# Patient Record
Sex: Female | Born: 1965 | Race: White | Hispanic: No | Smoking: Former smoker
Health system: Southern US, Community
[De-identification: ages and names within clinical notes are randomized; demographics above are authoritative.]

## PROBLEM LIST (undated history)

## (undated) DIAGNOSIS — Z973 Presence of spectacles and contact lenses: Secondary | ICD-10-CM

## (undated) DIAGNOSIS — Z87440 Personal history of urinary (tract) infections: Secondary | ICD-10-CM

## (undated) DIAGNOSIS — I447 Left bundle-branch block, unspecified: Secondary | ICD-10-CM

## (undated) DIAGNOSIS — N201 Calculus of ureter: Secondary | ICD-10-CM

## (undated) DIAGNOSIS — Z87442 Personal history of urinary calculi: Secondary | ICD-10-CM

## (undated) DIAGNOSIS — Z87448 Personal history of other diseases of urinary system: Secondary | ICD-10-CM

## (undated) DIAGNOSIS — K5090 Crohn's disease, unspecified, without complications: Secondary | ICD-10-CM

## (undated) DIAGNOSIS — R109 Unspecified abdominal pain: Secondary | ICD-10-CM

---

## 1990-12-21 HISTORY — PX: TUBAL LIGATION: SHX77

## 1997-12-21 HISTORY — PX: COLON RESECTION: SHX5231

## 2011-12-22 HISTORY — PX: EXTRACORPOREAL SHOCK WAVE LITHOTRIPSY: SHX1557

## 2017-02-12 ENCOUNTER — Emergency Department (HOSPITAL_COMMUNITY): Payer: Self-pay

## 2017-02-12 ENCOUNTER — Inpatient Hospital Stay (HOSPITAL_COMMUNITY)
Admission: EM | Admit: 2017-02-12 | Discharge: 2017-02-14 | DRG: 694 | Disposition: A | Discharge status: Home / Self Care | Payer: Self-pay | Attending: Family Medicine | Admitting: Family Medicine

## 2017-02-12 ENCOUNTER — Encounter (HOSPITAL_COMMUNITY): Payer: Self-pay | Admitting: Emergency Medicine

## 2017-02-12 DIAGNOSIS — I447 Left bundle-branch block, unspecified: Secondary | ICD-10-CM | POA: Diagnosis present

## 2017-02-12 DIAGNOSIS — R109 Unspecified abdominal pain: Secondary | ICD-10-CM | POA: Diagnosis present

## 2017-02-12 DIAGNOSIS — N138 Other obstructive and reflux uropathy: Secondary | ICD-10-CM

## 2017-02-12 DIAGNOSIS — D259 Leiomyoma of uterus, unspecified: Secondary | ICD-10-CM | POA: Diagnosis present

## 2017-02-12 DIAGNOSIS — N201 Calculus of ureter: Secondary | ICD-10-CM

## 2017-02-12 DIAGNOSIS — E860 Dehydration: Secondary | ICD-10-CM | POA: Diagnosis present

## 2017-02-12 DIAGNOSIS — N179 Acute kidney failure, unspecified: Secondary | ICD-10-CM | POA: Diagnosis present

## 2017-02-12 DIAGNOSIS — N132 Hydronephrosis with renal and ureteral calculous obstruction: Principal | ICD-10-CM | POA: Diagnosis present

## 2017-02-12 DIAGNOSIS — N1330 Unspecified hydronephrosis: Secondary | ICD-10-CM

## 2017-02-12 DIAGNOSIS — R10A2 Flank pain, left side: Secondary | ICD-10-CM | POA: Diagnosis present

## 2017-02-12 DIAGNOSIS — R112 Nausea with vomiting, unspecified: Secondary | ICD-10-CM | POA: Diagnosis present

## 2017-02-12 DIAGNOSIS — R000 Tachycardia, unspecified: Secondary | ICD-10-CM | POA: Diagnosis not present

## 2017-02-12 DIAGNOSIS — E876 Hypokalemia: Secondary | ICD-10-CM | POA: Diagnosis present

## 2017-02-12 DIAGNOSIS — N23 Unspecified renal colic: Secondary | ICD-10-CM

## 2017-02-12 DIAGNOSIS — K5090 Crohn's disease, unspecified, without complications: Secondary | ICD-10-CM | POA: Diagnosis present

## 2017-02-12 DIAGNOSIS — Z9049 Acquired absence of other specified parts of digestive tract: Secondary | ICD-10-CM

## 2017-02-12 DIAGNOSIS — R509 Fever, unspecified: Secondary | ICD-10-CM | POA: Diagnosis not present

## 2017-02-12 DIAGNOSIS — E871 Hypo-osmolality and hyponatremia: Secondary | ICD-10-CM | POA: Diagnosis present

## 2017-02-12 DIAGNOSIS — D696 Thrombocytopenia, unspecified: Secondary | ICD-10-CM | POA: Diagnosis present

## 2017-02-12 DIAGNOSIS — N200 Calculus of kidney: Secondary | ICD-10-CM

## 2017-02-12 DIAGNOSIS — N390 Urinary tract infection, site not specified: Secondary | ICD-10-CM | POA: Diagnosis present

## 2017-02-12 DIAGNOSIS — F17200 Nicotine dependence, unspecified, uncomplicated: Secondary | ICD-10-CM | POA: Diagnosis present

## 2017-02-12 DIAGNOSIS — Z87442 Personal history of urinary calculi: Secondary | ICD-10-CM

## 2017-02-12 HISTORY — DX: Crohn's disease, unspecified, without complications: K50.90

## 2017-02-12 LAB — COMPREHENSIVE METABOLIC PANEL
ALBUMIN: 3 g/dL — AB (ref 3.5–5.0)
ALK PHOS: 56 U/L (ref 38–126)
ALT: 26 U/L (ref 14–54)
AST: 37 U/L (ref 15–41)
Anion gap: 9 (ref 5–15)
BILIRUBIN TOTAL: 0.8 mg/dL (ref 0.3–1.2)
BUN: 23 mg/dL — AB (ref 6–20)
CALCIUM: 8.5 mg/dL — AB (ref 8.9–10.3)
CO2: 22 mmol/L (ref 22–32)
Chloride: 98 mmol/L — ABNORMAL LOW (ref 101–111)
Creatinine, Ser: 1.76 mg/dL — ABNORMAL HIGH (ref 0.44–1.00)
GFR calc Af Amer: 38 mL/min — ABNORMAL LOW (ref >60)
GFR calc non Af Amer: 33 mL/min — ABNORMAL LOW (ref >60)
GLUCOSE: 127 mg/dL — AB (ref 65–99)
POTASSIUM: 3.1 mmol/L — AB (ref 3.5–5.1)
SODIUM: 129 mmol/L — AB (ref 135–145)
TOTAL PROTEIN: 6 g/dL — AB (ref 6.5–8.1)

## 2017-02-12 LAB — CBC WITH DIFFERENTIAL/PLATELET
BASOS PCT: 0 %
Basophils Absolute: 0 10*3/uL (ref 0.0–0.1)
Eosinophils Absolute: 0.1 10*3/uL (ref 0.0–0.7)
Eosinophils Relative: 0 %
HEMATOCRIT: 40 % (ref 36.0–46.0)
HEMOGLOBIN: 14.3 g/dL (ref 12.0–15.0)
LYMPHS ABS: 0.3 10*3/uL — AB (ref 0.7–4.0)
Lymphocytes Relative: 2 %
MCH: 29.9 pg (ref 26.0–34.0)
MCHC: 35.8 g/dL (ref 30.0–36.0)
MCV: 83.7 fL (ref 78.0–100.0)
MONO ABS: 0.5 10*3/uL (ref 0.1–1.0)
Monocytes Relative: 4 %
NEUTROS ABS: 12.1 10*3/uL — AB (ref 1.7–7.7)
NEUTROS PCT: 93 %
Platelets: 106 10*3/uL — ABNORMAL LOW (ref 150–400)
RBC: 4.78 MIL/uL (ref 3.87–5.11)
RDW: 12.8 % (ref 11.5–15.5)
WBC: 13 10*3/uL — ABNORMAL HIGH (ref 4.0–10.5)

## 2017-02-12 LAB — URINALYSIS, ROUTINE W REFLEX MICROSCOPIC
Bilirubin Urine: NEGATIVE
Glucose, UA: NEGATIVE mg/dL
Ketones, ur: NEGATIVE mg/dL
Nitrite: POSITIVE — AB
PH: 5 (ref 5.0–8.0)
Protein, ur: 100 mg/dL — AB
SPECIFIC GRAVITY, URINE: 1.028 (ref 1.005–1.030)

## 2017-02-12 LAB — LIPASE, BLOOD: Lipase: 11 U/L (ref 11–51)

## 2017-02-12 LAB — LACTIC ACID, PLASMA: LACTIC ACID, VENOUS: 2.3 mmol/L — AB (ref 0.5–1.9)

## 2017-02-12 MED ORDER — POTASSIUM CHLORIDE 10 MEQ/100ML IV SOLN
10.0000 meq | Freq: Once | INTRAVENOUS | Status: AC
  Administered 2017-02-12: 10 meq via INTRAVENOUS
  Filled 2017-02-12: qty 100

## 2017-02-12 MED ORDER — FENTANYL CITRATE (PF) 100 MCG/2ML IJ SOLN
50.0000 ug | Freq: Once | INTRAMUSCULAR | Status: AC
  Administered 2017-02-12: 50 ug via INTRAMUSCULAR
  Filled 2017-02-12: qty 2

## 2017-02-12 MED ORDER — PROMETHAZINE HCL 25 MG/ML IJ SOLN
25.0000 mg | Freq: Once | INTRAMUSCULAR | Status: AC
  Administered 2017-02-12: 25 mg via INTRAMUSCULAR
  Filled 2017-02-12: qty 1

## 2017-02-12 MED ORDER — MORPHINE SULFATE (PF) 4 MG/ML IV SOLN: 4.0000 mg | INTRAVENOUS | Status: DC | PRN

## 2017-02-12 MED ORDER — MORPHINE SULFATE (PF) 2 MG/ML IV SOLN
2.0000 mg | INTRAVENOUS | Status: DC | PRN
  Administered 2017-02-12 – 2017-02-13 (×2): 2 mg via INTRAVENOUS
  Filled 2017-02-12 (×2): qty 1

## 2017-02-12 MED ORDER — DEXTROSE 5 % IV SOLN
1.0000 g | Freq: Once | INTRAVENOUS | Status: AC
  Administered 2017-02-12: 1 g via INTRAVENOUS
  Filled 2017-02-12: qty 10

## 2017-02-12 MED ORDER — SODIUM CHLORIDE 0.9 % IV BOLUS (SEPSIS)
2000.0000 mL | Freq: Once | INTRAVENOUS | Status: AC
  Administered 2017-02-12: 2000 mL via INTRAVENOUS

## 2017-02-12 MED ORDER — ONDANSETRON HCL 4 MG PO TABS: 4.0000 mg | ORAL_TABLET | Freq: Four times a day (QID) | ORAL | Status: DC | PRN

## 2017-02-12 MED ORDER — SODIUM CHLORIDE 0.9 % IV SOLN: INTRAVENOUS | Status: DC

## 2017-02-12 MED ORDER — ONDANSETRON HCL 4 MG/2ML IJ SOLN
4.0000 mg | Freq: Four times a day (QID) | INTRAMUSCULAR | Status: DC | PRN
  Administered 2017-02-12 – 2017-02-13 (×2): 4 mg via INTRAVENOUS
  Filled 2017-02-12 (×2): qty 2

## 2017-02-12 MED ORDER — ONDANSETRON HCL 4 MG/2ML IJ SOLN: 4.0000 mg | INTRAMUSCULAR | Status: DC | PRN

## 2017-02-12 NOTE — Consult Note (Signed)
Spoke with Dr. Thurnell Garbe regarding patient with poorly controlled pain due to left 24mm  Mid-ureteral stone with severe obstruction.  We discussed admitting the patient to the hospital service and transferring her to [facility].  She should have her electrolytes corrected and we will plan to place a stent tomorrow morning.

## 2017-02-12 NOTE — ED Notes (Signed)
CRITICAL VALUE ALERT  Critical value received:  Lactic Acid 2.3  Date of notification:  02/12/2017  Time of notification:  2344  Critical value read back:Yes.    Nurse who received alert:  Charlies Silvers, RN  MD notified (1st page):  Shanon Brow  Time of first page:  2346  MD notified (2nd page):  Time of second page:  Responding MD:  Rosana Hoes  Time MD responded:  [id number]

## 2017-02-12 NOTE — ED Triage Notes (Signed)
Patient complaining of left flank pain off and on x 4 days worsening yesterday. Also complaining of vomiting. States "I went to [facility] yesterday and they said I have a kidney stone. I don't have insurance so they just sent me home."

## 2017-02-12 NOTE — ED Provider Notes (Signed)
[facility] DEPT Provider Note   CSN: [id number] Arrival date & time: 02/12/17  1956     History   Chief Complaint Chief Complaint  Patient presents with  . Flank Pain    HPI Maria Chavez is a 51 y.o. female.  HPI  Pt was seen at 2025. Per pt, c/o sudden onset and persistence of waxing and waning left sided flank "pain" for the past 4 days.  Pt describes the pain as "like my last kidney stone." Has been associated with multiple intermittent episodes of N/V. Pt states she was evaluated at [facility] ED and "then discharged because I didn't have insurance." Pt cannot recall what prescriptions she was given. Denies vaginal bleeding/discharge, no dysuria/hematuria, no abd pain, no diarrhea, no black or blood in emesis, no CP/SOB, no fevers, no rash.    Past Medical History:  Diagnosis Date  . Crohn's disease ([place])   . Fibroids   . Kidney stones     There are no active problems to display for this patient.   Past Surgical History:  Procedure Laterality Date  . CHOLECYSTECTOMY    . COLON SURGERY    . TUBAL LIGATION      OB History    No data available       Home Medications    Prior to Admission medications   Not on File    Family History History reviewed. No pertinent family history.  Social History Social History  Substance Use Topics  . Smoking status: Current Every Day Smoker  . Smokeless tobacco: Never Used  . Alcohol use No     Allergies   Patient has no known allergies.   Review of Systems Review of Systems ROS: Statement: All systems negative except as marked or noted in the HPI; Constitutional: Negative for fever and chills. ; ; Eyes: Negative for eye pain, redness and discharge. ; ; ENMT: Negative for ear pain, hoarseness, nasal congestion, sinus pressure and sore throat. ; ; Cardiovascular: Negative for chest pain, palpitations, diaphoresis, dyspnea and peripheral edema. ; ; Respiratory: Negative for cough, wheezing and stridor. ; ;  Gastrointestinal: +N/V. Negative for diarrhea, abdominal pain, blood in stool, hematemesis, jaundice and rectal bleeding. . ; ; Genitourinary: +Flank pain. Negative for dysuria and hematuria. ; ; Musculoskeletal: Negative for neck pain. Negative for swelling and trauma.; ; Skin: Negative for pruritus, rash, abrasions, blisters, bruising and skin lesion.; ; Neuro: Negative for headache, lightheadedness and neck stiffness. Negative for weakness, altered level of consciousness, altered mental status, extremity weakness, paresthesias, involuntary movement, seizure and syncope.      Physical Exam Updated Vital Signs BP 103/67 (BP Location: Right Arm)   Pulse (!) 132   Temp 98.3 F (36.8 C) (Oral)   Resp 19   Ht 5\' 3"  (1.6 m)   Wt 130 lb (59 kg)   SpO2 99%   BMI 23.03 kg/m    BP 114/79 (BP Location: Right Arm)   Pulse 119   Temp 98.3 F (36.8 C) (Oral)   Resp 20   Ht 5\' 3"  (1.6 m)   Wt 130 lb (59 kg)   SpO2 100%   BMI 23.03 kg/m    Physical Exam 2030: Physical examination:  Nursing notes reviewed; Vital signs and O2 SAT reviewed;  Constitutional: Well developed, Well nourished, Well hydrated, Uncomfortable appearing.; Head:  Normocephalic, atraumatic; Eyes: EOMI, PERRL, No scleral icterus; ENMT: Mouth and pharynx normal, Mucous membranes moist; Neck: Supple, Full range of motion, No lymphadenopathy; Cardiovascular: Tachycardic rate and  rhythm, No gallop; Respiratory: Breath sounds clear & equal bilaterally, No wheezes.  Speaking full sentences with ease, Normal respiratory effort/excursion; Chest: Nontender, Movement normal; Abdomen: Soft, Nontender, Nondistended, Normal bowel sounds; Genitourinary: No CVA tenderness; Spine:  No midline CS, TS, LS tenderness. +TTP left lumbar paraspinal muscles.;; Extremities: Pulses normal, No tenderness, No edema, No calf edema or asymmetry.; Neuro: AA&Ox3, Major CN grossly intact.  Speech clear. No gross focal motor or sensory deficits in extremities.  Climbs on and off stretcher easily by herself. Gait steady.; Skin: Color normal, Warm, Dry.   ED Treatments / Results  Labs (all labs ordered are listed, but only abnormal results are displayed)   EKG  EKG Interpretation None       Radiology   Procedures Procedures (including critical care time)  Medications Ordered in ED Medications  fentaNYL (SUBLIMAZE) injection 50 mcg (not administered)  promethazine (PHENERGAN) injection 25 mg (not administered)     Initial Impression / Assessment and Plan / ED Course  I have reviewed the triage vital signs and the nursing notes.  Pertinent labs & imaging results that were available during my care of the patient were reviewed by me and considered in my medical decision making (see chart for details).  MDM Reviewed: nursing note and vitals Interpretation: labs and CT scan Total time providing critical care: 30-74 minutes. This excludes time spent performing separately reportable procedures and services. Consults: urology and admitting MD   CRITICAL CARE Performed by: Alfonzo Feller Total critical care time: 35 minutes Critical care time was exclusive of separately billable procedures and treating other patients. Critical care was necessary to treat or prevent imminent or life-threatening deterioration. Critical care was time spent personally by me on the following activities: development of treatment plan with patient and/or surrogate as well as nursing, discussions with consultants, evaluation of patient's response to treatment, examination of patient, obtaining history from patient or surrogate, ordering and performing treatments and interventions, ordering and review of laboratory studies, ordering and review of radiographic studies, pulse oximetry and re-evaluation of patient's condition.   Results for orders placed or performed during the hospital encounter of 02/12/17  Urinalysis, Routine w reflex microscopic- may I&O  cath if menses  Result Value Ref Range   Color, Urine YELLOW YELLOW   APPearance HAZY (A) CLEAR   Specific Gravity, Urine 1.028 1.005 - 1.030   pH 5.0 5.0 - 8.0   Glucose, UA NEGATIVE NEGATIVE mg/dL   Hgb urine dipstick MODERATE (A) NEGATIVE   Bilirubin Urine NEGATIVE NEGATIVE   Ketones, ur NEGATIVE NEGATIVE mg/dL   Protein, ur 100 (A) NEGATIVE mg/dL   Nitrite POSITIVE (A) NEGATIVE   Leukocytes, UA TRACE (A) NEGATIVE   RBC / HPF 0-5 0 - 5 RBC/hpf   WBC, UA 0-5 0 - 5 WBC/hpf   Bacteria, UA RARE (A) NONE SEEN  Comprehensive metabolic panel  Result Value Ref Range   Sodium 129 (L) 135 - 145 mmol/L   Potassium 3.1 (L) 3.5 - 5.1 mmol/L   Chloride 98 (L) 101 - 111 mmol/L   CO2 22 22 - 32 mmol/L   Glucose, Bld 127 (H) 65 - 99 mg/dL   BUN 23 (H) 6 - 20 mg/dL   Creatinine, Ser 1.76 (H) 0.44 - 1.00 mg/dL   Calcium 8.5 (L) 8.9 - 10.3 mg/dL   Total Protein 6.0 (L) 6.5 - 8.1 g/dL   Albumin 3.0 (L) 3.5 - 5.0 g/dL   AST 37 15 - 41 U/L   ALT  26 14 - 54 U/L   Alkaline Phosphatase 56 38 - 126 U/L   Total Bilirubin 0.8 0.3 - 1.2 mg/dL   GFR calc non Af Amer 33 (L) >60 mL/min   GFR calc Af Amer 38 (L) >60 mL/min   Anion gap 9 5 - 15  Lipase, blood  Result Value Ref Range   Lipase 11 11 - 51 U/L  CBC with Differential  Result Value Ref Range   WBC 13.0 (H) 4.0 - 10.5 K/uL   RBC 4.78 3.87 - 5.11 MIL/uL   Hemoglobin 14.3 12.0 - 15.0 g/dL   HCT 40.0 36.0 - 46.0 %   MCV 83.7 78.0 - 100.0 fL   MCH 29.9 26.0 - 34.0 pg   MCHC 35.8 30.0 - 36.0 g/dL   RDW 12.8 11.5 - 15.5 %   Platelets 106 (L) 150 - 400 K/uL   Neutrophils Relative % 93 %   Neutro Abs 12.1 (H) 1.7 - 7.7 K/uL   Lymphocytes Relative 2 %   Lymphs Abs 0.3 (L) 0.7 - 4.0 K/uL   Monocytes Relative 4 %   Monocytes Absolute 0.5 0.1 - 1.0 K/uL   Eosinophils Relative 0 %   Eosinophils Absolute 0.1 0.0 - 0.7 K/uL   Basophils Relative 0 %   Basophils Absolute 0.0 0.0 - 0.1 K/uL   Ct Renal Stone Study Result Date: 02/12/2017 CLINICAL  DATA:  Initial evaluation for acute left flank pain for 4 days. Per history, recent CT scan with IV contrast. EXAM: CT ABDOMEN AND PELVIS WITHOUT CONTRAST TECHNIQUE: Multidetector CT imaging of the abdomen and pelvis was performed following the standard protocol without IV contrast. COMPARISON:  None available. FINDINGS: Lower chest: Small layering left pleural effusion with associated atelectasis. Scattered atelectatic changes with trace effusion at the right lung base is well. Hepatobiliary: The liver demonstrates a normal unenhanced appearance. Gallbladder surgically absent. Prominence of the common bile duct like related to post cholecystectomy changes. Pancreas: Pancreas within normal limits. Spleen: Spleen within normal limits. Adrenals/Urinary Tract: Adrenal glands are normal. Left kidney is asymmetrically enlarged as compared to the right. Parenchymal contrast staining present within left kidney with secrete IV contrast material within the proximal left renal collecting system. There is an obstructive stone measuring approximately 12 mm at the left UPJ (series 5, image 36). No IV contrast material seen distally within the left ureter. Associated perinephric and periureteral fat stranding. Moderate to severe left hydronephrosis. No other definite stones within left kidney, although evaluation limited by presence of IV contrast. Small amount secrete IV contrast material present within the right renal collecting system. No definite right-sided renal or ureteral calculi. No right-sided hydronephrosis or hydroureter. Bladder partially distended with secreted contrast material within the bladder lumen. Stomach/Bowel: Stomach within normal limits. No evidence for bowel obstruction. Appendix appears to be surgically absent. Additional anastomotic suture present about the sigmoid colon. No acute inflammatory changes about the bowels. Scattered free fluid approximates the descending colon related to the obstructive  process within the left kidney. Vascular/Lymphatic: Mild aortic atherosclerosis. No aneurysm. No adenopathy. Reproductive: Uterus and ovaries within normal limits. Other: No free air. Small amount of free fluid related to the obstructive process within the left kidney. Tiny fat containing paraumbilical hernia noted. Musculoskeletal: No acute osseus abnormality. No worrisome lytic or blastic osseous lesions. Central disc protrusion noted at L5-S1. IMPRESSION: 1. 12 mm obstructive left UPJ stone with secondary moderate to severe left hydronephrosis. Retained IV contrast material on board from recent CT. 2.  Small layering left pleural effusion with associated atelectasis. 3. Central disc protrusion at L5-S1. Electronically Signed   By: Jeannine Boga M.D.   On: 02/12/2017 21:43    2225:  Hyponatremia on labs; judicious IVF started 2/2 hydronephrosis. Potassium repleted IV. +UTI, UC pending; will dose IV rocephin. T/C to U[facility] Dr. Louis Meckel, case discussed, including:  HPI, pertinent PM/SHx, VS/PE, dx testing, ED course and treatment:  Agreeable to consult, requests to transfer to [facility] under [facility], he will take pt to OR tomorrow morning. T/C to [facility] Dr. Shanon Brow, case discussed, including:  HPI, pertinent PM/SHx, VS/PE, dx testing, ED course and treatment:  Agreeable to facilitate transfer/admit to [facility].     Final Clinical Impressions(s) / ED Diagnoses   Final diagnoses:  None    New Prescriptions New Prescriptions   No medications on file     Francine Graven, DO 02/14/17 1527

## 2017-02-12 NOTE — H&P (Signed)
History and Physical    Maria Chavez [id number] DOB: [birth year] DOA: 02/12/2017  PCP: No PCP Per Patient  Patient coming from: home  Chief Complaint:  Left flank pain, n/v  HPI: Maria Chavez is a 51 y.o. female with medical history significant of crohns comes in with 4 days of left flank pain, vomiting.  Pt went to [place] ed yesterday started on abx she thinks is levaquin, her symptoms just got worse.  Reports fevers.  No dysuria.  Vomit nonbloody.  Pt found to have 12 mm stone on left, urology called at [facility] dr Louis Meckel who advised transfer there for procedure in am.   Pt has no h/o kidney problems.  Review of Systems: As per HPI otherwise 10 point review of systems negative.   Past Medical History:  Diagnosis Date  . Crohn's disease ([place])   . Fibroids   . Kidney stones     Past Surgical History:  Procedure Laterality Date  . CHOLECYSTECTOMY    . COLON SURGERY    . TUBAL LIGATION       reports that she has been smoking.  She has never used smokeless tobacco. She reports that she does not drink alcohol or use drugs.  No Known Allergies  History reviewed. No pertinent family history. no premature CAD  Prior to Admission medications   Not on File  abx from yesterday  Physical Exam: Vitals:   02/12/17 2018 02/12/17 2019  BP: 103/67   Pulse: (!) 132   Resp: 19   Temp: 98.3 F (36.8 C)   TempSrc: Oral   SpO2: 99%   Weight:  59 kg (130 lb)  Height:  5\' 3"  (1.6 m)     Constitutional: NAD, calm, comfortable, dehydrated Vitals:   02/12/17 2018 02/12/17 2019  BP: 103/67   Pulse: (!) 132   Resp: 19   Temp: 98.3 F (36.8 C)   TempSrc: Oral   SpO2: 99%   Weight:  59 kg (130 lb)  Height:  5\' 3"  (1.6 m)   Eyes: PERRL, lids and conjunctivae normal ENMT: Mucous membranes are moist. Posterior pharynx clear of any exudate or lesions.Normal dentition.  Neck: normal, supple, no masses, no thyromegaly Respiratory: clear to auscultation bilaterally, no  wheezing, no crackles. Normal respiratory effort. No accessory muscle use.  Cardiovascular: Regular rate and rhythm, no murmurs / rubs / gallops. No extremity edema. 2+ pedal pulses. No carotid bruits.  Abdomen: left flank tenderness, no masses palpated. No hepatosplenomegaly. Bowel sounds positive.  Musculoskeletal: no clubbing / cyanosis. No joint deformity upper and lower extremities. Good ROM, no contractures. Normal muscle tone.  Skin: no rashes, lesions, ulcers. No induration Neurologic: CN 2-12 grossly intact. Sensation intact, DTR normal. Strength 5/5 in all 4.  Psychiatric: Normal judgment and insight. Alert and oriented x 3. Normal mood.    Labs on Admission: I have personally reviewed following labs and imaging studies  CBC:  Recent Labs Lab 02/12/17 2056  WBC 13.0*  NEUTROABS 12.1*  HGB 14.3  HCT 40.0  MCV 83.7  PLT [id number]*   Basic Metabolic Panel:  Recent Labs Lab 02/12/17 2056  NA 129*  K 3.1*  CL 98*  CO2 22  GLUCOSE 127*  BUN 23*  CREATININE 1.76*  CALCIUM 8.5*   GFR: Estimated Creatinine Clearance: 31.6 mL/min (by C-G formula based on SCr of 1.76 mg/dL (H)). Liver Function Tests:  Recent Labs Lab 02/12/17 2056  AST 37  ALT 26  ALKPHOS 56  BILITOT 0.8  PROT  6.0*  ALBUMIN 3.0*    Recent Labs Lab 02/12/17 2056  LIPASE 11   Urine analysis:    Component Value Date/Time   COLORURINE YELLOW 02/12/2017 2023   APPEARANCEUR HAZY (A) 02/12/2017 2023   LABSPEC 1.028 02/12/2017 2023   PHURINE 5.0 02/12/2017 2023   GLUCOSEU NEGATIVE 02/12/2017 2023   HGBUR MODERATE (A) 02/12/2017 2023   BILIRUBINUR NEGATIVE 02/12/2017 2023   KETONESUR NEGATIVE 02/12/2017 2023   PROTEINUR 100 (A) 02/12/2017 2023   NITRITE POSITIVE (A) 02/12/2017 2023   LEUKOCYTESUR TRACE (A) 02/12/2017 2023   Radiological Exams on Admission: Ct Renal Stone Study  Result Date: 02/12/2017 CLINICAL DATA:  Initial evaluation for acute left flank pain for 4 days. Per history,  recent CT scan with IV contrast. EXAM: CT ABDOMEN AND PELVIS WITHOUT CONTRAST TECHNIQUE: Multidetector CT imaging of the abdomen and pelvis was performed following the standard protocol without IV contrast. COMPARISON:  None available. FINDINGS: Lower chest: Small layering left pleural effusion with associated atelectasis. Scattered atelectatic changes with trace effusion at the right lung base is well. Hepatobiliary: The liver demonstrates a normal unenhanced appearance. Gallbladder surgically absent. Prominence of the common bile duct like related to post cholecystectomy changes. Pancreas: Pancreas within normal limits. Spleen: Spleen within normal limits. Adrenals/Urinary Tract: Adrenal glands are normal. Left kidney is asymmetrically enlarged as compared to the right. Parenchymal contrast staining present within left kidney with secrete IV contrast material within the proximal left renal collecting system. There is an obstructive stone measuring approximately 12 mm at the left UPJ (series 5, image 36). No IV contrast material seen distally within the left ureter. Associated perinephric and periureteral fat stranding. Moderate to severe left hydronephrosis. No other definite stones within left kidney, although evaluation limited by presence of IV contrast. Small amount secrete IV contrast material present within the right renal collecting system. No definite right-sided renal or ureteral calculi. No right-sided hydronephrosis or hydroureter. Bladder partially distended with secreted contrast material within the bladder lumen. Stomach/Bowel: Stomach within normal limits. No evidence for bowel obstruction. Appendix appears to be surgically absent. Additional anastomotic suture present about the sigmoid colon. No acute inflammatory changes about the bowels. Scattered free fluid approximates the descending colon related to the obstructive process within the left kidney. Vascular/Lymphatic: Mild aortic  atherosclerosis. No aneurysm. No adenopathy. Reproductive: Uterus and ovaries within normal limits. Other: No free air. Small amount of free fluid related to the obstructive process within the left kidney. Tiny fat containing paraumbilical hernia noted. Musculoskeletal: No acute osseus abnormality. No worrisome lytic or blastic osseous lesions. Central disc protrusion noted at L5-S1. IMPRESSION: 1. 12 mm obstructive left UPJ stone with secondary moderate to severe left hydronephrosis. Retained IV contrast material on board from recent CT. 2. Small layering left pleural effusion with associated atelectasis. 3. Central disc protrusion at L5-S1. Electronically Signed   By: Jeannine Boga M.D.   On: 02/12/2017 21:43    EKG: Independently reviewed. Sinus tachy Old chart reviewed Case discussed with edp Case discussed with ed rn   Assessment/Plan 51 yo female with 4 days of obs left renal stone with AKI and dehydration  Principal Problem:   Urinary tract obstruction due to kidney stone- with AKI cr up to 1.8.  Give 2 liters ivf bolus now.  Cover with rocephin.  uc sent.  Check lactic acid level.  Transfer to [place] keep npo for procedure in am for stone removal by urology team.  Active Problems:   AKI (acute kidney injury) (  [place])- aggressive ivf, repeat cr in am   Acute left flank pain- due to above   Nausea & vomiting- due to above, prn zofran ordered   Hypokalemia- replete through iv, due to vomiting     DVT prophylaxis:  scds Code Status:  full Family Communication: none  Disposition Plan:  Per day team, likely tomorrow after lithotripsy Consults called:  Urology at [facility] Admission status:  Observation status   Maniah Nading A MD [facility]  If 7PM-7AM, please contact night-coverage [email or web address] Password [facility]  02/12/2017, 10:47 PM

## 2017-02-13 ENCOUNTER — Encounter (HOSPITAL_COMMUNITY)
Admission: EM | Disposition: A | Discharge status: Home / Self Care | Payer: Self-pay | Source: Home / Self Care | Attending: Family Medicine

## 2017-02-13 ENCOUNTER — Observation Stay (HOSPITAL_BASED_OUTPATIENT_CLINIC_OR_DEPARTMENT_OTHER): Payer: Self-pay

## 2017-02-13 ENCOUNTER — Observation Stay (HOSPITAL_COMMUNITY): Payer: Self-pay | Admitting: Anesthesiology

## 2017-02-13 ENCOUNTER — Encounter (HOSPITAL_COMMUNITY): Payer: Self-pay | Admitting: Anesthesiology

## 2017-02-13 DIAGNOSIS — R9431 Abnormal electrocardiogram [ECG] [EKG]: Secondary | ICD-10-CM

## 2017-02-13 HISTORY — PX: TRANSTHORACIC ECHOCARDIOGRAM: SHX275

## 2017-02-13 HISTORY — PX: CYSTOSCOPY W/ URETERAL STENT PLACEMENT: SHX1429

## 2017-02-13 LAB — BASIC METABOLIC PANEL
Anion gap: 9 (ref 5–15)
BUN: 23 mg/dL — AB (ref 6–20)
CHLORIDE: 104 mmol/L (ref 101–111)
CO2: 22 mmol/L (ref 22–32)
CREATININE: 1.53 mg/dL — AB (ref 0.44–1.00)
Calcium: 8.4 mg/dL — ABNORMAL LOW (ref 8.9–10.3)
GFR calc Af Amer: 45 mL/min — ABNORMAL LOW (ref >60)
GFR, EST NON AFRICAN AMERICAN: 39 mL/min — AB (ref >60)
GLUCOSE: 112 mg/dL — AB (ref 65–99)
POTASSIUM: 3.3 mmol/L — AB (ref 3.5–5.1)
Sodium: 135 mmol/L (ref 135–145)

## 2017-02-13 LAB — CBC
HEMATOCRIT: 37 % (ref 36.0–46.0)
Hemoglobin: 13.2 g/dL (ref 12.0–15.0)
MCH: 29.4 pg (ref 26.0–34.0)
MCHC: 35.7 g/dL (ref 30.0–36.0)
MCV: 82.4 fL (ref 78.0–100.0)
Platelets: 92 10*3/uL — ABNORMAL LOW (ref 150–400)
RBC: 4.49 MIL/uL (ref 3.87–5.11)
RDW: 13 % (ref 11.5–15.5)
WBC: 11.9 10*3/uL — ABNORMAL HIGH (ref 4.0–10.5)

## 2017-02-13 LAB — TROPONIN I
Troponin I: 0.04 ng/mL (ref <0.03)
Troponin I: 0.03 ng/mL (ref <0.03)
Troponin I: 0.03 ng/mL (ref <0.03)

## 2017-02-13 LAB — ECHOCARDIOGRAM COMPLETE
Height: 63 in
Weight: 2080 oz

## 2017-02-13 LAB — SURGICAL PCR SCREEN
MRSA, PCR: NEGATIVE
Staphylococcus aureus: NEGATIVE

## 2017-02-13 LAB — HIV ANTIBODY (ROUTINE TESTING W REFLEX): HIV SCREEN 4TH GENERATION: NONREACTIVE

## 2017-02-13 LAB — LACTIC ACID, PLASMA: Lactic Acid, Venous: 1.9 mmol/L (ref 0.5–1.9)

## 2017-02-13 SURGERY — CYSTOSCOPY, WITH RETROGRADE PYELOGRAM AND URETERAL STENT INSERTION
Anesthesia: General | Site: Ureter | Laterality: Left

## 2017-02-13 MED ORDER — MEPERIDINE HCL 50 MG/ML IJ SOLN: 6.2500 mg | INTRAMUSCULAR | Status: DC | PRN

## 2017-02-13 MED ORDER — DEXTROSE 5 % IV SOLN
1.0000 g | INTRAVENOUS | Status: DC
  Administered 2017-02-13: 1 g via INTRAVENOUS
  Filled 2017-02-13 (×2): qty 10

## 2017-02-13 MED ORDER — ONDANSETRON 4 MG PO TBDP
4.0000 mg | ORAL_TABLET | ORAL | Status: DC | PRN
  Administered 2017-02-14: 4 mg via ORAL
  Filled 2017-02-13 (×2): qty 1

## 2017-02-13 MED ORDER — DEXAMETHASONE SODIUM PHOSPHATE 10 MG/ML IJ SOLN
INTRAMUSCULAR | Status: DC | PRN
  Administered 2017-02-13: 10 mg via INTRAVENOUS

## 2017-02-13 MED ORDER — OXYCODONE-ACETAMINOPHEN 5-325 MG PO TABS
1.0000 | ORAL_TABLET | ORAL | Status: DC | PRN
  Administered 2017-02-13 – 2017-02-14 (×4): 1 via ORAL
  Filled 2017-02-13 (×4): qty 1

## 2017-02-13 MED ORDER — ONDANSETRON HCL 4 MG/2ML IJ SOLN
INTRAMUSCULAR | Status: DC | PRN
  Administered 2017-02-13: 4 mg via INTRAVENOUS

## 2017-02-13 MED ORDER — FENTANYL CITRATE (PF) 100 MCG/2ML IJ SOLN
INTRAMUSCULAR | Status: AC
  Filled 2017-02-13: qty 2

## 2017-02-13 MED ORDER — PHENYLEPHRINE 40 MCG/ML (10ML) SYRINGE FOR IV PUSH (FOR BLOOD PRESSURE SUPPORT)
PREFILLED_SYRINGE | INTRAVENOUS | Status: DC | PRN
  Administered 2017-02-13 (×3): 80 ug via INTRAVENOUS

## 2017-02-13 MED ORDER — PHENYLEPHRINE 40 MCG/ML (10ML) SYRINGE FOR IV PUSH (FOR BLOOD PRESSURE SUPPORT)
PREFILLED_SYRINGE | INTRAVENOUS | Status: AC
  Filled 2017-02-13: qty 10

## 2017-02-13 MED ORDER — NICOTINE 14 MG/24HR TD PT24
14.0000 mg | MEDICATED_PATCH | Freq: Every day | TRANSDERMAL | Status: DC
  Administered 2017-02-13 – 2017-02-14 (×2): 14 mg via TRANSDERMAL
  Filled 2017-02-13 (×2): qty 1

## 2017-02-13 MED ORDER — PROPOFOL 10 MG/ML IV BOLUS
INTRAVENOUS | Status: DC | PRN
  Administered 2017-02-13: 160 mg via INTRAVENOUS

## 2017-02-13 MED ORDER — LIDOCAINE 2% (20 MG/ML) 5 ML SYRINGE
INTRAMUSCULAR | Status: AC
  Filled 2017-02-13: qty 5

## 2017-02-13 MED ORDER — IOHEXOL 300 MG/ML  SOLN
INTRAMUSCULAR | Status: DC | PRN
  Administered 2017-02-13: 5 mL

## 2017-02-13 MED ORDER — MIDAZOLAM HCL 2 MG/2ML IJ SOLN
INTRAMUSCULAR | Status: AC
  Filled 2017-02-13: qty 2

## 2017-02-13 MED ORDER — MORPHINE SULFATE (PF) 4 MG/ML IV SOLN
2.0000 mg | INTRAVENOUS | Status: DC | PRN
  Administered 2017-02-13 – 2017-02-14 (×7): 2 mg via INTRAVENOUS
  Filled 2017-02-13 (×8): qty 1

## 2017-02-13 MED ORDER — PROPOFOL 10 MG/ML IV BOLUS
INTRAVENOUS | Status: AC
  Filled 2017-02-13: qty 20

## 2017-02-13 MED ORDER — SODIUM CHLORIDE 0.9 % IV SOLN
INTRAVENOUS | Status: AC
  Administered 2017-02-13: 1000 mL via INTRAVENOUS
  Administered 2017-02-13: 09:00:00 via INTRAVENOUS

## 2017-02-13 MED ORDER — CIPROFLOXACIN IN D5W 400 MG/200ML IV SOLN
INTRAVENOUS | Status: AC
  Filled 2017-02-13: qty 200

## 2017-02-13 MED ORDER — ENOXAPARIN SODIUM 40 MG/0.4ML ~~LOC~~ SOLN
40.0000 mg | SUBCUTANEOUS | Status: DC
  Administered 2017-02-14: 40 mg via SUBCUTANEOUS
  Filled 2017-02-13: qty 0.4

## 2017-02-13 MED ORDER — CIPROFLOXACIN IN D5W 400 MG/200ML IV SOLN
INTRAVENOUS | Status: DC | PRN
  Administered 2017-02-13: 400 mg via INTRAVENOUS

## 2017-02-13 MED ORDER — POTASSIUM CHLORIDE CRYS ER 20 MEQ PO TBCR
40.0000 meq | EXTENDED_RELEASE_TABLET | Freq: Once | ORAL | Status: AC
  Administered 2017-02-13: 40 meq via ORAL
  Filled 2017-02-13: qty 2

## 2017-02-13 MED ORDER — LIDOCAINE 2% (20 MG/ML) 5 ML SYRINGE
INTRAMUSCULAR | Status: DC | PRN
  Administered 2017-02-13: 100 mg via INTRAVENOUS

## 2017-02-13 MED ORDER — FENTANYL CITRATE (PF) 100 MCG/2ML IJ SOLN
INTRAMUSCULAR | Status: DC | PRN
  Administered 2017-02-13 (×2): 50 ug via INTRAVENOUS

## 2017-02-13 MED ORDER — SODIUM CHLORIDE 0.9 % IR SOLN
Status: DC | PRN
  Administered 2017-02-13: 3000 mL via INTRAVESICAL

## 2017-02-13 MED ORDER — HYDROMORPHONE HCL 1 MG/ML IJ SOLN
0.2500 mg | INTRAMUSCULAR | Status: DC | PRN
  Administered 2017-02-13 (×2): 0.25 mg via INTRAVENOUS
  Administered 2017-02-13: 0.5 mg via INTRAVENOUS

## 2017-02-13 MED ORDER — HYDROMORPHONE HCL 1 MG/ML IJ SOLN
INTRAMUSCULAR | Status: AC
  Administered 2017-02-13: 10:00:00
  Filled 2017-02-13: qty 1

## 2017-02-13 MED ORDER — MIDAZOLAM HCL 5 MG/5ML IJ SOLN
INTRAMUSCULAR | Status: DC | PRN
  Administered 2017-02-13: 2 mg via INTRAVENOUS

## 2017-02-13 MED ORDER — METOCLOPRAMIDE HCL 5 MG/ML IJ SOLN: 10.0000 mg | Freq: Once | INTRAMUSCULAR | Status: DC | PRN

## 2017-02-13 SURGICAL SUPPLY — 20 items
BAG URINE DRAINAGE (UROLOGICAL SUPPLIES) ×3 IMPLANT
BAG URO CATCHER STRL LF (MISCELLANEOUS) ×3 IMPLANT
BASKET DAKOTA 1.9FR 11X120 (BASKET) IMPLANT
BASKET ZERO TIP NITINOL 2.4FR (BASKET) IMPLANT
CATH URET 5FR 28IN OPEN ENDED (CATHETERS) ×3 IMPLANT
CLOTH BEACON ORANGE TIMEOUT ST (SAFETY) ×3 IMPLANT
GLOVE BIOGEL M STRL SZ7.5 (GLOVE) ×3 IMPLANT
GOWN STRL REUS W/TWL XL LVL3 (GOWN DISPOSABLE) ×3 IMPLANT
GUIDEWIRE ANG ZIPWIRE 038X150 (WIRE) IMPLANT
GUIDEWIRE STR DUAL SENSOR (WIRE) ×3 IMPLANT
MANIFOLD NEPTUNE II (INSTRUMENTS) ×3 IMPLANT
PACK CYSTO (CUSTOM PROCEDURE TRAY) ×3 IMPLANT
SHEATH ACCESS URETERAL 24CM (SHEATH) IMPLANT
SHEATH ACCESS URETERAL 38CM (SHEATH) IMPLANT
SHEATH ACCESS URETERAL 54CM (SHEATH) IMPLANT
STENT URET 6FRX24 CONTOUR (STENTS) ×3 IMPLANT
TRAY FOLEY CATH 16FR SILVER (SET/KITS/TRAYS/PACK) ×3 IMPLANT
TUBING CONNECTING 10 (TUBING) ×2 IMPLANT
TUBING CONNECTING 10' (TUBING) ×1
WIRE COONS/BENSON .038X145CM (WIRE) IMPLANT

## 2017-02-13 NOTE — Progress Notes (Signed)
  Echocardiogram 2D Echocardiogram has been performed.  Maria Chavez 02/13/2017, 3:45 PM

## 2017-02-13 NOTE — Anesthesia Procedure Notes (Signed)
Procedure Name: LMA Insertion Date/Time: 02/13/2017 8:33 AM Performed by: Noralyn Pick D Pre-anesthesia Checklist: Patient identified, Emergency Drugs available, Suction available and Patient being monitored Patient Re-evaluated:Patient Re-evaluated prior to inductionOxygen Delivery Method: Circle system utilized Preoxygenation: Pre-oxygenation with 100% oxygen Intubation Type: IV induction Ventilation: Mask ventilation without difficulty LMA Size: 3.0 Tube type: Oral Number of attempts: 1 Placement Confirmation: positive ETCO2 and breath sounds checked- equal and bilateral Tube secured with: Tape Dental Injury: Teeth and Oropharynx as per pre-operative assessment

## 2017-02-13 NOTE — Addendum Note (Signed)
Addendum  created 02/13/17 1124 by Josephine Igo, MD   Sign clinical note

## 2017-02-13 NOTE — Transfer of Care (Signed)
Immediate Anesthesia Transfer of Care Note  Patient: Maria Chavez  Procedure(s) Performed: Procedure(s): CYSTOSCOPY WITH LEFT RETROGRADE PYELOGRAM/LEFT URETERAL STENT PLACEMENT (Left)  Patient Location: PACU  Anesthesia Type:General  Level of Consciousness: awake, alert  and oriented  Airway & Oxygen Therapy: Patient Spontanous Breathing and Patient connected to face mask oxygen  Post-op Assessment: Report given to RN and Post -op Vital signs reviewed and stable  Post vital signs: Reviewed and stable  Last Vitals:  Vitals:   02/13/17 0227 02/13/17 0546  BP: 120/58 (!) 94/53  Pulse: 110 (!) 111  Resp: 18 18  Temp: 36.4 C 37.3 C    Last Pain:  Vitals:   02/13/17 0809  TempSrc:   PainSc: 7       Patients Stated Pain Goal: 1 ([id number])  Complications: No apparent anesthesia complications

## 2017-02-13 NOTE — Progress Notes (Signed)
Transported patient down to OR for procedure.

## 2017-02-13 NOTE — Progress Notes (Signed)
Patient is now status post left ureteral stent placement. A urine culture was sent from the renal pelvis during the procedure.  During the procedure, the patient was noted to spike a temperature of 38.3, and was tachycardic to the 1 thirties. She also  Has a left bundle branch block. There was no comparative EKG.  In the PACU, we will obtain blood cultures, and troponins. I alerted the hospitalist to these developments.

## 2017-02-13 NOTE — Anesthesia Preprocedure Evaluation (Addendum)
Anesthesia Evaluation  Patient identified by MRN, date of birth, ID band Patient awake    Reviewed: Allergy & Precautions, NPO status , Patient's Chart, lab work & pertinent test results  Airway Mallampati: II  TM Distance: >3 FB Neck ROM: Full    Dental no notable dental hx. (+) Teeth Intact   Pulmonary Current Smoker,    Pulmonary exam normal breath sounds clear to auscultation       Cardiovascular  Rhythm:Regular Rate:Tachycardia     Neuro/Psych negative neurological ROS  negative psych ROS   GI/Hepatic Neg liver ROS, Crohn's disease   Endo/Other  negative endocrine ROS  Renal/GU Renal InsufficiencyRenal diseaseLeft ureteral calculus with obstruction Acute kidney Injury  negative genitourinary   Musculoskeletal negative musculoskeletal ROS (+)   Abdominal   Peds  Hematology  (+) Blood dyscrasia, , Thrombocytopenia-?etiology   Anesthesia Other Findings Clubbing of fingers  Reproductive/Obstetrics Fibroid uterus                            Anesthesia Physical Anesthesia Plan  ASA: II and emergent  Anesthesia Plan: General   Post-op Pain Management:    Induction: Intravenous  Airway Management Planned: LMA  Additional Equipment:   Intra-op Plan:   Post-operative Plan: Extubation in OR  Informed Consent: I have reviewed the patients History and Physical, chart, labs and discussed the procedure including the risks, benefits and alternatives for the proposed anesthesia with the patient or authorized representative who has indicated his/her understanding and acceptance.   Dental advisory given  Plan Discussed with: Anesthesiologist, CRNA and Surgeon  Anesthesia Plan Comments:         Anesthesia Quick Evaluation

## 2017-02-13 NOTE — Op Note (Signed)
Preoperative diagnosis: left obstructing ureteral stone Postoperative diagnosis: Same  Procedures performed: Cystoscopy, left retrograde pyelogram with interpretation, left ureteral stent placement  Surgeon: Dr. Ardis Hughs  Findings: A retrograde pyelogram was performed, contrast was instilled into the left collecting system and there was a small filling defect in the ureter at the expected location of the stone and proximal hydronephrosis. There were no other abnormalities.  Specimens:left renal pelvis urine culture  Procedure in detail: Patient was consented prior to being brought back to the operating room. He was then brought back to the operating room placed on the table in supine position. General anesthesia was then induced and endotracheal tube inserted. This then placed in dorsolithotomy position and prepped and draped in the routine sterile fashion. A timeout was held.  Using a 22.5 [place] cystoscope with a 30  lens, I gently passed the scope into the patient's urethra and into the bladder under visual guidance. A 360 cystoscopic evaluation was performed with no mucosal abnormalities, no tumors, and no foreign bodies identified. I then passed a 0. 038 Sensor wire into the left ureteral orifice and into the left renal pelvis. I then slid a 5 [place] open-ended ureteral catheter over the wire and into the renal pelvis and removed the wire.   I then aspirated 10 cc of urine which was sent for culture. Next I injected 10 cc of contrast into the renal collecting system and performed a retrograde pyelogram. I then replaced the wire through the open-ended ureteral catheter and remove the catheter. I then slid a24 cm x 6 French double-J ureteral stent over the wire and into the renal pelvis under fluoroscopic guidance. Once a nice curl was noted in the renal pelvis I advanced the distal end of the stent into the bladder before removing the wire completely. A final fluoroscopic image was  obtained confirming the curl in the renal pelvis as well as a curl in the bladder.   Disposition: The patient returned to the PACU in stable condition.

## 2017-02-13 NOTE — Consult Note (Signed)
I have been asked to see the patient by Dr. Francine Graven, for evaluation and management of obstructing left ureteral stone.  History of present illness: 51 year old female with a history of nephrolithiasis who presented to the emergency department yesterday with 4 days of intermittent severe left sided flank and abdominal pain. She has had associated nausea and vomiting. She has not had any fevers or chills. She was seen in the [facility] emergency department and discharged home. She subsequently re-presented to the [facility] emergency room where a workup demonstrated electrolyte derangements, nitrite positive urine, and a CT scan demonstrating a 12 mm left proximal ureteral stone with associated high-grade obstruction. The patient was subsequently [facility] and [facility] to [facility].  Since the patient has been admitted, she has had poorly controlled pain. She has had associated nausea. She has not had any fevers. Her pain is not been well controlled with IV pain medication. The pain is located on the left flank and radiates down around to the left groin.  The patient has a history of kidney stones, and has had stents and lithotripsy in the past. She was last seen in [facility] Dr. Exie Parody.  Review of systems: A 12 point comprehensive review of systems was obtained and is negative unless otherwise stated in the history of present illness.  Patient Active Problem List   Diagnosis Date Noted  . Urinary tract obstruction due to kidney stone 02/12/2017  . AKI (acute kidney injury) ([place]) 02/12/2017  . Acute left flank pain 02/12/2017  . Nausea & vomiting 02/12/2017  . Hypokalemia 02/12/2017  . Stone in kidney 02/12/2017    No current facility-administered medications on file prior to encounter.    No current outpatient prescriptions on file prior to encounter.    Past Medical History:  Diagnosis Date  . Crohn's disease ([place])   . Fibroids   . Kidney stones      Past Surgical History:  Procedure Laterality Date  . CHOLECYSTECTOMY    . COLON SURGERY    . TUBAL LIGATION      Social History  Substance Use Topics  . Smoking status: Current Every Day Smoker  . Smokeless tobacco: Never Used  . Alcohol use No    History reviewed. No pertinent family history.  PE: Vitals:   02/12/17 2019 02/12/17 2304 02/13/17 0227 02/13/17 0546  BP:  114/79 120/58 (!) 94/53  Pulse:  119 110 (!) 111  Resp:  20 18 18   Temp:   97.5 F (36.4 C) 99.1 F (37.3 C)  TempSrc:    Oral  SpO2:  100% 99% 100%  Weight: 59 kg (130 lb)     Height: 5\' 3"  (1.6 m)      Patient appears to be Significant distress patient is alert and oriented x3 Atraumatic normocephalic head No cervical or supraclavicular lymphadenopathy appreciated No increased work of breathing, no audible wheezes/rhonchi Regular sinus rhythm/rate Abdomen is soft, nontender, nondistended, Severe left CVA tenderness Lower extremities are symmetric without appreciable edema Grossly neurologically intact No identifiable skin lesions   Recent Labs  02/12/17 2056 02/13/17 0511  WBC 13.0* 11.9*  HGB 14.3 13.2  HCT 40.0 37.0    Recent Labs  02/12/17 2056 02/13/17 0511  NA 129* 135  K 3.1* 3.3*  CL 98* 104  CO2 22 22  GLUCOSE 127* 112*  BUN 23* 23*  CREATININE 1.76* 1.53*  CALCIUM 8.5* 8.4*   No results for input(s): LABPT, INR in the last 72  hours. No results for input(s): LABURIN in the last 72 hours. Results for orders placed or performed during the hospital encounter of 02/12/17  Surgical pcr screen     Status: None   Collection Time: 02/13/17  4:33 AM  Result Value Ref Range Status   MRSA, PCR NEGATIVE NEGATIVE Final   Staphylococcus aureus NEGATIVE NEGATIVE Final    Comment:        The Xpert SA Assay (FDA approved for NASAL specimens in patients over 39 years of age), is one component of a comprehensive surveillance program.  Test performance has been validated by  [facility] for patients greater than or equal to 46 year old. It is not intended to diagnose infection nor to guide or monitor treatment.     Imaging: I have reviewed the patient's CT scan demonstrating a high-grade left-sided ureteral obstruction secondary to a 12 mm stone.  Imp: The patient has poorly controlled pain and nitrite positive urine concerning for urinary tract infection secondary to a large obstructing left-sided midureteral stone.  Recommendations: I recommended that we proceed urgently to the operating room for ureteral stent placement. This would help significantly with the patient's pain and allow her infection to drain. We will then plan to schedule her for follow-up ureteroscopy, laser lithotripsy in 2 weeks. The patient should be maintained on oral antibiotics for at least 1 week. I will contact the patient on Monday with a OR time.   Louis Meckel W

## 2017-02-13 NOTE — Progress Notes (Signed)
Patient to remain in PACU until results from Troponin received

## 2017-02-13 NOTE — Progress Notes (Signed)
PROGRESS NOTE    Maria Chavez  [id number] DOB: 05/18/1966 DOA: 02/12/2017 PCP: No PCP Per Patient  Brief Narrative: Maria Chavez is a 51 y.o. female with a history of crohn disease and recurrent nephrolithiasis. She presented with left flank pain and was found to have an urinary tract obstruction secondary to nephrolithiasis   Assessment & Plan:   Principal Problem:   Urinary tract obstruction due to kidney stone Active Problems:   AKI (acute kidney injury) ([place])   Acute left flank pain   Nausea & vomiting   Hypokalemia   Stone in kidney   Urinary tract obstruction Nephrolithiasis S/p stent placed this morning.  LBBB New since procedure this morning. -repeat EKG -transfer to telemetry -cardiology consult  Fever Occurred during surgery procedure this morning. Resolved without Tylenol. Likely related to urinary tract infection. -continue ceftriaxone -urine culture pending  Acute kidney injury Improved with IV fluids  Nausea Vomiting -continue zofran prn  Hypokalemia -kdur x1  DVT prophylaxis: Lovenox Code Status: Full code Family Communication: Daughter at bedside Disposition Plan: Discharge pending cardiac workup   Consultants:   Urology  Procedures:   Renal stent (2/24)  Antimicrobials:   Ceftriaxone (2/23>>    Subjective: Patient reports some flank pain. No chest pain, dyspnea or abdominal pain  Objective: Vitals:   02/12/17 2019 02/12/17 2304 02/13/17 0227 02/13/17 0546  BP:  114/79 120/58 (!) 94/53  Pulse:  119 110 (!) 111  Resp:  20 18 18   Temp:   97.5 F (36.4 C) 99.1 F (37.3 C)  TempSrc:    Oral  SpO2:  100% 99% 100%  Weight: 59 kg (130 lb)     Height: 5\' 3"  (1.6 m)       Intake/Output Summary (Last 24 hours) at 02/13/17 0853 Last data filed at 02/13/17 0600  Gross per 24 hour  Intake          2383.33 ml  Output              300 ml  Net          2083.33 ml   Filed Weights   02/12/17 2019  Weight: 59 kg  (130 lb)    Examination:  General exam: Appears calm and comfortable Respiratory system: Clear to auscultation. Respiratory effort normal. Cardiovascular system: S1 & S2 heard, Rapid rate regular rhythm. No murmurs Gastrointestinal system: Abdomen is nondistended, soft and nontender. Normal bowel sounds heard. Central nervous system: Alert and oriented. No focal neurological deficits. Extremities: No edema. No calf tenderness Skin: No cyanosis. No rashes Psychiatry: Judgement and insight appear normal. Mood & affect appropriate.     Data Reviewed: I have personally reviewed following labs and imaging studies  CBC:  Recent Labs Lab 02/12/17 2056 02/13/17 0511  WBC 13.0* 11.9*  NEUTROABS 12.1*  --   HGB 14.3 13.2  HCT 40.0 37.0  MCV 83.7 82.4  PLT 106* 92*   Basic Metabolic Panel:  Recent Labs Lab 02/12/17 2056 02/13/17 0511  NA 129* 135  K 3.1* 3.3*  CL 98* 104  CO2 22 22  GLUCOSE 127* 112*  BUN 23* 23*  CREATININE 1.76* 1.53*  CALCIUM 8.5* 8.4*   GFR: Estimated Creatinine Clearance: 36.4 mL/min (by C-G formula based on SCr of 1.53 mg/dL (H)). Liver Function Tests:  Recent Labs Lab 02/12/17 2056  AST 37  ALT 26  ALKPHOS 56  BILITOT 0.8  PROT 6.0*  ALBUMIN 3.0*    Recent Labs Lab 02/12/17 2056  LIPASE  11   No results for input(s): AMMONIA in the last 168 hours. Coagulation Profile: No results for input(s): INR, PROTIME in the last 168 hours. Cardiac Enzymes: No results for input(s): CKTOTAL, CKMB, CKMBINDEX, TROPONINI in the last 168 hours. BNP (last 3 results) No results for input(s): PROBNP in the last 8760 hours. HbA1C: No results for input(s): HGBA1C in the last 72 hours. CBG: No results for input(s): GLUCAP in the last 168 hours. Lipid Profile: No results for input(s): CHOL, HDL, LDLCALC, TRIG, CHOLHDL, LDLDIRECT in the last 72 hours. Thyroid Function Tests: No results for input(s): TSH, T4TOTAL, FREET4, T3FREE, THYROIDAB in the last  72 hours. Anemia Panel: No results for input(s): VITAMINB12, FOLATE, FERRITIN, TIBC, IRON, RETICCTPCT in the last 72 hours. Sepsis Labs:  Recent Labs Lab 02/12/17 2323 02/13/17 0212  LATICACIDVEN 2.3* 1.9    Recent Results (from the past 240 hour(s))  Surgical pcr screen     Status: None   Collection Time: 02/13/17  4:33 AM  Result Value Ref Range Status   MRSA, PCR NEGATIVE NEGATIVE Final   Staphylococcus aureus NEGATIVE NEGATIVE Final    Comment:        The Xpert SA Assay (FDA approved for NASAL specimens in patients over 55 years of age), is one component of a comprehensive surveillance program.  Test performance has been validated by [facility] for patients greater than or equal to 4 year old. It is not intended to diagnose infection nor to guide or monitor treatment.          Radiology Studies: Ct Renal Stone Study  Result Date: 02/12/2017 CLINICAL DATA:  Initial evaluation for acute left flank pain for 4 days. Per history, recent CT scan with IV contrast. EXAM: CT ABDOMEN AND PELVIS WITHOUT CONTRAST TECHNIQUE: Multidetector CT imaging of the abdomen and pelvis was performed following the standard protocol without IV contrast. COMPARISON:  None available. FINDINGS: Lower chest: Small layering left pleural effusion with associated atelectasis. Scattered atelectatic changes with trace effusion at the right lung base is well. Hepatobiliary: The liver demonstrates a normal unenhanced appearance. Gallbladder surgically absent. Prominence of the common bile duct like related to post cholecystectomy changes. Pancreas: Pancreas within normal limits. Spleen: Spleen within normal limits. Adrenals/Urinary Tract: Adrenal glands are normal. Left kidney is asymmetrically enlarged as compared to the right. Parenchymal contrast staining present within left kidney with secrete IV contrast material within the proximal left renal collecting system. There is an obstructive stone measuring  approximately 12 mm at the left UPJ (series 5, image 36). No IV contrast material seen distally within the left ureter. Associated perinephric and periureteral fat stranding. Moderate to severe left hydronephrosis. No other definite stones within left kidney, although evaluation limited by presence of IV contrast. Small amount secrete IV contrast material present within the right renal collecting system. No definite right-sided renal or ureteral calculi. No right-sided hydronephrosis or hydroureter. Bladder partially distended with secreted contrast material within the bladder lumen. Stomach/Bowel: Stomach within normal limits. No evidence for bowel obstruction. Appendix appears to be surgically absent. Additional anastomotic suture present about the sigmoid colon. No acute inflammatory changes about the bowels. Scattered free fluid approximates the descending colon related to the obstructive process within the left kidney. Vascular/Lymphatic: Mild aortic atherosclerosis. No aneurysm. No adenopathy. Reproductive: Uterus and ovaries within normal limits. Other: No free air. Small amount of free fluid related to the obstructive process within the left kidney. Tiny fat containing paraumbilical hernia noted. Musculoskeletal: No acute osseus abnormality.  No worrisome lytic or blastic osseous lesions. Central disc protrusion noted at L5-S1. IMPRESSION: 1. 12 mm obstructive left UPJ stone with secondary moderate to severe left hydronephrosis. Retained IV contrast material on board from recent CT. 2. Small layering left pleural effusion with associated atelectasis. 3. Central disc protrusion at L5-S1. Electronically Signed   By: Jeannine Boga M.D.   On: 02/12/2017 21:43        Scheduled Meds: . [MAR Hold] cefTRIAXone (ROCEPHIN)  IV  1 g Intravenous Q24H   Continuous Infusions: . sodium chloride 1,000 mL (02/13/17 0408)     LOS: 0 days     Cordelia Poche, MD [facility] 02/13/2017, 8:53  AM Pager: [phone]  If 7PM-7AM, please contact night-coverage [email or web address] Password [facility] 02/13/2017, 8:53 AM

## 2017-02-13 NOTE — Progress Notes (Signed)
CRITICAL VALUE ALERT  Critical value received:  troponin  Date of notification:  02/13/2017  Time of notification:   1337  Critical value read back: yes  Nurse who received alert:  Floor nurse  MD notified (1st page):   Dr. Lonny Prude  Time of first page:  1335  MD notified (2nd page):  Time of second page:  Responding MD:  Lonny Prude  Time MD responded:  [phone]

## 2017-02-13 NOTE — Anesthesia Postprocedure Evaluation (Addendum)
Anesthesia Post Note  Patient: Maria Chavez  Procedure(s) Performed: Procedure(s) (LRB): CYSTOSCOPY WITH LEFT RETROGRADE PYELOGRAM/LEFT URETERAL STENT PLACEMENT (Left)  Patient location during evaluation: PACU Anesthesia Type: General Level of consciousness: awake and alert Pain management: pain level controlled Vital Signs Assessment: post-procedure vital signs reviewed and stable Respiratory status: spontaneous breathing, nonlabored ventilation, respiratory function stable and patient connected to nasal cannula oxygen Cardiovascular status: blood pressure returned to baseline and stable Postop Assessment: no signs of nausea or vomiting Anesthetic complications: no Comments: Patient noted to have LBBB pattern on connecting her to the monitor in the OR. Due to the urgency of the surgery we proceeded with the planned procedure. 12 lead EKG on arrival to the PACU confirmed Sinus tachycardia with LBBB pattern. Troponin's ordered. Dr. Louis Meckel informed. Hospitalists to see and evaluate patient.       Last Vitals:  Vitals:   02/13/17 1050 02/13/17 1100  BP:  98/70  Pulse: (!) 110 (!) 104  Resp: 15 14  Temp:      Last Pain:  Vitals:   02/13/17 1100  TempSrc:   PainSc: Asleep                 Maria Prosser A.

## 2017-02-14 DIAGNOSIS — N138 Other obstructive and reflux uropathy: Secondary | ICD-10-CM

## 2017-02-14 DIAGNOSIS — E876 Hypokalemia: Secondary | ICD-10-CM

## 2017-02-14 DIAGNOSIS — R112 Nausea with vomiting, unspecified: Secondary | ICD-10-CM

## 2017-02-14 DIAGNOSIS — R109 Unspecified abdominal pain: Secondary | ICD-10-CM

## 2017-02-14 DIAGNOSIS — I447 Left bundle-branch block, unspecified: Secondary | ICD-10-CM

## 2017-02-14 DIAGNOSIS — N200 Calculus of kidney: Secondary | ICD-10-CM

## 2017-02-14 DIAGNOSIS — N179 Acute kidney failure, unspecified: Secondary | ICD-10-CM

## 2017-02-14 LAB — URINE CULTURE: Culture: NO GROWTH

## 2017-02-14 MED ORDER — OXYCODONE-ACETAMINOPHEN 5-325 MG PO TABS: 1.0000 | ORAL_TABLET | ORAL | 0 refills | Status: DC | PRN

## 2017-02-14 MED ORDER — ZOLPIDEM TARTRATE 5 MG PO TABS
5.0000 mg | ORAL_TABLET | Freq: Once | ORAL | Status: DC
  Filled 2017-02-14: qty 1

## 2017-02-14 MED ORDER — NITROFURANTOIN MONOHYD MACRO 100 MG PO CAPS: 100.0000 mg | ORAL_CAPSULE | Freq: Two times a day (BID) | ORAL | 0 refills | Status: DC

## 2017-02-14 NOTE — Care Management Note (Signed)
Case Management Note  Patient Details  Name: Maria Chavez MRN: [id number] Date of Birth: Apr 24, 1966  Subjective/Objective:  status post left ureteral stent placement                  Action/Plan: Discharge Planning: AVS reviewed: NCM spoke to pt and states she does work. She cannot afford health insurance at this time. States she can follow up with [facility] in [place].  She has Oxycodone $13 and Macrobid is $23 at [employer]. Sent goodrx coupon via text.    Expected Discharge Date:  02/14/17               Expected Discharge Plan:  Home/Self Care  In-House Referral:  NA  Discharge planning Services  CM Consult, Medication Assistance  Post Acute Care Choice:  NA Choice offered to:  NA  DME Arranged:  N/A DME Agency:  NA  HH Arranged:  NA HH Agency:  NA  Status of Service:  Completed, signed off  If discussed at [place] of Stay Meetings, dates discussed:    Additional Comments:  Erenest Rasher, RN 02/14/2017, 7:38 PM

## 2017-02-14 NOTE — Discharge Instructions (Signed)
Ms. Adut, Aegerter were admitted because of your kidney stone. A stent was placed which has improved your symptoms. You will be discharged on 7 days of antibiotics per recommendations by your Urologist. Please follow-up with him in 1-2 weeks. While here, you had a new finding of Left Bundle Branch Block. A cardiologist evaluated you and felt you should get a stress test as an outpatient. Information has been placed in your discharge paperwork for proper follow-up.

## 2017-02-14 NOTE — Consult Note (Signed)
Primary cardiologist: N/A Consulting cardiologist: Dr Carlyle Dolly Requesting physician: Dr Lonny Prude Indication: LBBB  Clinical Summary [name] is a 51 y.o.female history of Crohns disease admitted with left flank pain. Found to have left kidney stone and urinary obstruction and AKI. S/p stent placement by urology 02/13/17. Preprocedure patient noted to have LBBB of unknown chronicity, there are no old EKGs in our system. She does not believe she has had a prior EKG in several years, does not have regular medical care. She denies any chest pain or SOB. CAD risk factors: HTN, HL, smoking.   K 3.3 Cr 1.53 Hgb 13.2 Plt 92 Trop 0.04-->0.03-->0.03 Echo LVEF 55%, no WMAs, grade I diastolic dysfunction, probable PFO EKG Sinus tach, LBB     No Known Allergies  Medications Scheduled Medications: . cefTRIAXone (ROCEPHIN)  IV  1 g Intravenous Q24H  . enoxaparin (LOVENOX) injection  40 mg Subcutaneous Q24H  . nicotine  14 mg Transdermal Daily  . zolpidem  5 mg Oral Once     Infusions:   PRN Medications:  morphine injection, [DISCONTINUED] ondansetron **OR** ondansetron (ZOFRAN) IV, ondansetron, oxyCODONE-acetaminophen   Past Medical History:  Diagnosis Date  . Crohn's disease ([place])   . Fibroids   . Kidney stones     Past Surgical History:  Procedure Laterality Date  . CHOLECYSTECTOMY    . COLON SURGERY    . TUBAL LIGATION      History reviewed. No pertinent family history.  Social History [name] reports that she has been smoking.  She has never used smokeless tobacco. Ms. Escarsega reports that she does not drink alcohol.  Review of Systems CONSTITUTIONAL: No weight loss, fever, chills, weakness or fatigue.  HEENT: Eyes: No visual loss, blurred vision, double vision or yellow sclerae. No hearing loss, sneezing, congestion, runny nose or sore throat.  SKIN: No rash or itching.  CARDIOVASCULAR: No chest pain, chest pressure or chest discomfort. No palpitations  or edema.  RESPIRATORY: No shortness of breath, cough or sputum.  GASTROINTESTINAL: No anorexia, nausea, vomiting or diarrhea. No abdominal pain or blood.  GENITOURINARY: no polyuria, no dysuria NEUROLOGICAL: No headache, dizziness, syncope, paralysis, ataxia, numbness or tingling in the extremities. No change in bowel or bladder control.  MUSCULOSKELETAL: No muscle, back pain, joint pain or stiffness.  HEMATOLOGIC: No anemia, bleeding or bruising.  LYMPHATICS: No enlarged nodes. No history of splenectomy.  PSYCHIATRIC: No history of depression or anxiety.      Physical Examination Blood pressure 130/89, pulse 96, temperature 98.8 F (37.1 C), temperature source Oral, resp. rate 18, height 5\' 3"  (1.6 m), weight 130 lb (59 kg), SpO2 96 %.  Intake/Output Summary (Last 24 hours) at 02/14/17 1006 Last data filed at 02/14/17 0830  Gross per 24 hour  Intake              795 ml  Output             1500 ml  Net             -705 ml    HEENT: sclera clear, throat clear  Cardiovascular: RRR, no m/r/g, no jvd  Respiratory: CTAB  GI: abdomen soft, NT, ND  MSK: no LE edema  Neuro: no focal deficits  Psych: appropriate affect   Lab Results  Basic Metabolic Panel:  Recent Labs Lab 02/12/17 2056 02/13/17 0511  NA 129* 135  K 3.1* 3.3*  CL 98* 104  CO2 22 22  GLUCOSE 127* 112*  BUN  23* 23*  CREATININE 1.76* 1.53*  CALCIUM 8.5* 8.4*    Liver Function Tests:  Recent Labs Lab 02/12/17 2056  AST 37  ALT 26  ALKPHOS 56  BILITOT 0.8  PROT 6.0*  ALBUMIN 3.0*    CBC:  Recent Labs Lab 02/12/17 2056 02/13/17 0511  WBC 13.0* 11.9*  NEUTROABS 12.1*  --   HGB 14.3 13.2  HCT 40.0 37.0  MCV 83.7 82.4  PLT 106* 92*    Cardiac Enzymes:  Recent Labs Lab 02/13/17 0943 02/13/17 1550 02/13/17 2125  TROPONINI 0.04* 0.03* 0.03*    BNP: Invalid input(s): POCBNP     Impression/Recommendations 1. LBBB - unknown chronicity, there are no prior EKGs in our  system. Patient does not think she has had one for several years - no cardiopulmonary symptoms. Very mild flat troponin in setting of tachycardia and AKI, nonspecific. Echo with normal LVEF and no WMAs.  - LBBB certaintly is not constistent with acute LBBB/MI. 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 Given that she has CAD risk factors she will require a lexiscan MPI for further evaluation. However this does not have to be an inpatient test, if plans otherwise for discharge we can arrange for testing as outpatient, if plans for other medical issues to stay in hospital we can arrange as inpatient - she needs referral for [facility] assistance, I have asked her to discuss with primary team   We will arrange outpatient clinic f/u. At that time pending [facility] assistance application consider ordering lexiscan for her. - would start ASA 81mg  daily.      Carlyle Dolly, M.D.

## 2017-02-14 NOTE — Discharge Summary (Signed)
Physician Discharge Summary  [name] [id number] DOB: 06-09-1966 DOA: 02/12/2017  PCP: No PCP Per Patient  Admit date: 02/12/2017 Discharge date: 02/14/2017  Admitted From: Home Disposition: Home  Recommendations for Outpatient Follow-up:  1. Follow up with PCP in 1 week 2. Follow up with urology in 1-2 weeks 3. Follow up with cardiology for stress test 4. Please obtain BMP/CBC in one week to recheck creatinine and platelets 5. Please follow up on the following pending results: urine culture from cystoscopy. Blood cultures  Home Health: None Equipment/Devices: None  Discharge Condition: Stable CODE STATUS: Full code   Brief/Interim Summary:  Admission HPI written by Steward Ros, MD   Chief Complaint:  Left flank pain, n/v  HPI: Maria Chavez is a 51 y.o. female with medical history significant of crohns comes in with 4 days of left flank pain, vomiting.  Pt went to [facility] yesterday started on abx she thinks is levaquin, her symptoms just got worse.  Reports fevers.  No dysuria.  Vomit nonbloody.  Pt found to have 12 mm stone on left, urology called at [facility] dr Louis Meckel who advised transfer there for procedure in am.   Pt has no h/o kidney problems.    Hospital course:  Urinary tract obstruction Nephrolithiasis S/p stent placed. Initial ceftriaxone for empiric coverage and transitioned to Dr. Charmaine Downs at discharge per neurology recommendations.  LBBB Unknown chronicity. Patient is unaware of her previous diagnosis. She hardly elevated troponin which is very flat. No associated chest pain or dyspnea. Cardiology was consulted and had low suspicion for MI. Recommended follow-up as an outpatient for outpatient stress test.  Fever Occurred during surgery procedure this morning. Resolved without Tylenol. Likely related to urinary tract infection. Afebrile for the rest of her admission. Blood cultures obtained with no growth for discharge. Urine culture from  cystoscopy pending at discharge  Acute kidney injury Improved with IV fluids  Nausea Vomiting Treated with Zofran. Resolved before discharge  Hypokalemia Patient given potassium supplementation.  Thrombocytopenia Unknown if this is chronic. Follow-up as an outpatient.  Discharge Diagnoses:  Principal Problem:   Urinary tract obstruction due to kidney stone Active Problems:   AKI (acute kidney injury) ([place])   Acute left flank pain   Nausea & vomiting   Hypokalemia   Stone in kidney    Discharge Instructions   Allergies as of 02/14/2017   No Known Allergies     Medication List    STOP taking these medications   levofloxacin 500 MG tablet Commonly known as:  LEVAQUIN     TAKE these medications   nitrofurantoin (macrocrystal-monohydrate) 100 MG capsule Commonly known as:  MACROBID Take 1 capsule (100 mg total) by mouth 2 (two) times daily.   ondansetron 4 MG disintegrating tablet Commonly known as:  ZOFRAN-ODT Take 4 mg by mouth every 4 (four) hours as needed for nausea or vomiting.   oxyCODONE-acetaminophen 5-325 MG tablet Commonly known as:  PERCOCET/ROXICET Take 1 tablet by mouth every 4 (four) hours as needed for moderate pain or severe pain.   tamsulosin 0.4 MG Caps capsule Commonly known as:  FLOMAX Take 0.4 mg by mouth daily. 10 day course starting on 01/2017      Follow-up Information    Ardis Hughs, MD. Schedule an appointment as soon as possible for a visit in 2 week(s).   Specialty:  Urology Contact information: [place] [address] [phone]        [facility]. Schedule an appointment as  soon as possible for a visit in 1 week(s).   Why:  Stress test Contact information: [place] [phone]         No Known Allergies  Consultations:  Urology  Cardiology   Procedures/Studies: Ct Renal Stone Study  Result Date: 02/12/2017 CLINICAL DATA:  Initial  evaluation for acute left flank pain for 4 days. Per history, recent CT scan with IV contrast. EXAM: CT ABDOMEN AND PELVIS WITHOUT CONTRAST TECHNIQUE: Multidetector CT imaging of the abdomen and pelvis was performed following the standard protocol without IV contrast. COMPARISON:  None available. FINDINGS: Lower chest: Small layering left pleural effusion with associated atelectasis. Scattered atelectatic changes with trace effusion at the right lung base is well. Hepatobiliary: The liver demonstrates a normal unenhanced appearance. Gallbladder surgically absent. Prominence of the common bile duct like related to post cholecystectomy changes. Pancreas: Pancreas within normal limits. Spleen: Spleen within normal limits. Adrenals/Urinary Tract: Adrenal glands are normal. Left kidney is asymmetrically enlarged as compared to the right. Parenchymal contrast staining present within left kidney with secrete IV contrast material within the proximal left renal collecting system. There is an obstructive stone measuring approximately 12 mm at the left UPJ (series 5, image 36). No IV contrast material seen distally within the left ureter. Associated perinephric and periureteral fat stranding. Moderate to severe left hydronephrosis. No other definite stones within left kidney, although evaluation limited by presence of IV contrast. Small amount secrete IV contrast material present within the right renal collecting system. No definite right-sided renal or ureteral calculi. No right-sided hydronephrosis or hydroureter. Bladder partially distended with secreted contrast material within the bladder lumen. Stomach/Bowel: Stomach within normal limits. No evidence for bowel obstruction. Appendix appears to be surgically absent. Additional anastomotic suture present about the sigmoid colon. No acute inflammatory changes about the bowels. Scattered free fluid approximates the descending colon related to the obstructive process within  the left kidney. Vascular/Lymphatic: Mild aortic atherosclerosis. No aneurysm. No adenopathy. Reproductive: Uterus and ovaries within normal limits. Other: No free air. Small amount of free fluid related to the obstructive process within the left kidney. Tiny fat containing paraumbilical hernia noted. Musculoskeletal: No acute osseus abnormality. No worrisome lytic or blastic osseous lesions. Central disc protrusion noted at L5-S1. IMPRESSION: 1. 12 mm obstructive left UPJ stone with secondary moderate to severe left hydronephrosis. Retained IV contrast material on board from recent CT. 2. Small layering left pleural effusion with associated atelectasis. 3. Central disc protrusion at L5-S1. Electronically Signed   By: Jeannine Boga M.D.   On: 02/12/2017 21:43       Subjective: Patient reports minimal left flank pain. Afebrile overnight. No nausea or vomiting  Discharge Exam: Vitals:   02/13/17 2139 02/14/17 0509  BP: 110/85 130/89  Pulse: 99 96  Resp: 18 18  Temp: 98.9 F (37.2 C) 98.8 F (37.1 C)   Vitals:   02/13/17 1138 02/13/17 1742 02/13/17 2139 02/14/17 0509  BP: 105/66 113/90 110/85 130/89  Pulse: (!) 106 98 99 96  Resp:  18 18 18   Temp: 98.2 F (36.8 C) 98.7 F (37.1 C) 98.9 F (37.2 C) 98.8 F (37.1 C)  TempSrc:  Oral Oral Oral  SpO2: 95% 100% 93% 96%  Weight:      Height:        General exam: Appears calm and comfortable Respiratory system: Clear to auscultation. Respiratory effort normal. Cardiovascular system: S1 & S2 heard, regular rate regular rhythm. No murmurs Gastrointestinal system: Abdomen  is nondistended, soft and nontender. Normal bowel sounds heard. Central nervous system: Alert and oriented. No focal neurological deficits. Extremities: No edema. No calf tenderness Skin: No cyanosis. No rashes. Area around incision without erythema Psychiatry: Judgement and insight appear normal. Mood & affect appropriate.   The results of significant  diagnostics from this hospitalization (including imaging, microbiology, ancillary and laboratory) are listed below for reference.     Microbiology: Recent Results (from the past 240 hour(s))  Urine culture     Status: None   Collection Time: 02/12/17  8:18 PM  Result Value Ref Range Status   Specimen Description URINE, CLEAN CATCH  Final   Special Requests NONE  Final   Culture   Final    NO GROWTH Performed at [facility], [address] [place], [place] [address]    Report Status 02/14/2017 FINAL  Final  Surgical pcr screen     Status: None   Collection Time: 02/13/17  4:33 AM  Result Value Ref Range Status   MRSA, PCR NEGATIVE NEGATIVE Final   Staphylococcus aureus NEGATIVE NEGATIVE Final    Comment:        The Xpert SA Assay (FDA approved for NASAL specimens in patients over 60 years of age), is one component of a comprehensive surveillance program.  Test performance has been validated by [facility] for patients greater than or equal to 51 year old. It is not intended to diagnose infection nor to guide or monitor treatment.   Urine culture     Status: None (Preliminary result)   Collection Time: 02/13/17  8:59 AM  Result Value Ref Range Status   Specimen Description CYSTOSCOPY  Final   Special Requests NONE  Final   Culture   Final    CULTURE REINCUBATED FOR BETTER GROWTH Performed at [facility], [place] [address], [place], [place] [address]    Report Status PENDING  Incomplete     Labs: BNP (last 3 results) No results for input(s): BNP in the last 8760 hours. Basic Metabolic Panel:  Recent Labs Lab 02/12/17 2056 02/13/17 0511  NA 129* 135  K 3.1* 3.3*  CL 98* 104  CO2 22 22  GLUCOSE 127* 112*  BUN 23* 23*  CREATININE 1.76* 1.53*  CALCIUM 8.5* 8.4*   Liver Function Tests:  Recent Labs Lab 02/12/17 2056  AST 37  ALT 26  ALKPHOS 56  BILITOT 0.8  PROT 6.0*  ALBUMIN 3.0*    Recent Labs Lab 02/12/17 2056  LIPASE 11   No  results for input(s): AMMONIA in the last 168 hours. CBC:  Recent Labs Lab 02/12/17 2056 02/13/17 0511  WBC 13.0* 11.9*  NEUTROABS 12.1*  --   HGB 14.3 13.2  HCT 40.0 37.0  MCV 83.7 82.4  PLT 106* 92*   Cardiac Enzymes:  Recent Labs Lab 02/13/17 0943 02/13/17 1550 02/13/17 2125  TROPONINI 0.04* 0.03* 0.03*   BNP: Invalid input(s): POCBNP CBG: No results for input(s): GLUCAP in the last 168 hours. D-Dimer No results for input(s): DDIMER in the last 72 hours. Hgb A1c No results for input(s): HGBA1C in the last 72 hours. Lipid Profile No results for input(s): CHOL, HDL, LDLCALC, TRIG, CHOLHDL, LDLDIRECT in the last 72 hours. Thyroid function studies No results for input(s): TSH, T4TOTAL, T3FREE, THYROIDAB in the last 72 hours.  Invalid input(s): FREET3 Anemia work up No results for input(s): VITAMINB12, FOLATE, FERRITIN, TIBC, IRON, RETICCTPCT in the last 72 hours. Urinalysis    Component  Value Date/Time   COLORURINE YELLOW 02/12/2017 2023   APPEARANCEUR HAZY (A) 02/12/2017 2023   LABSPEC 1.028 02/12/2017 2023   PHURINE 5.0 02/12/2017 2023   GLUCOSEU NEGATIVE 02/12/2017 2023   HGBUR MODERATE (A) 02/12/2017 2023   BILIRUBINUR NEGATIVE 02/12/2017 2023   KETONESUR NEGATIVE 02/12/2017 2023   PROTEINUR 100 (A) 02/12/2017 2023   NITRITE POSITIVE (A) 02/12/2017 2023   LEUKOCYTESUR TRACE (A) 02/12/2017 2023   Sepsis Labs Invalid input(s): PROCALCITONIN,  WBC,  LACTICIDVEN Microbiology Recent Results (from the past 240 hour(s))  Urine culture     Status: None   Collection Time: 02/12/17  8:18 PM  Result Value Ref Range Status   Specimen Description URINE, CLEAN CATCH  Final   Special Requests NONE  Final   Culture   Final    NO GROWTH Performed at [facility], [place] [address], [place], [place] [address]    Report Status 02/14/2017 FINAL  Final  Surgical pcr screen     Status: None   Collection Time: 02/13/17  4:33 AM  Result Value Ref Range Status    MRSA, PCR NEGATIVE NEGATIVE Final   Staphylococcus aureus NEGATIVE NEGATIVE Final    Comment:        The Xpert SA Assay (FDA approved for NASAL specimens in patients over 44 years of age), is one component of a comprehensive surveillance program.  Test performance has been validated by [facility] for patients greater than or equal to 12 year old. It is not intended to diagnose infection nor to guide or monitor treatment.   Urine culture     Status: None (Preliminary result)   Collection Time: 02/13/17  8:59 AM  Result Value Ref Range Status   Specimen Description CYSTOSCOPY  Final   Special Requests NONE  Final   Culture   Final    CULTURE REINCUBATED FOR BETTER GROWTH Performed at [facility], [place] [address], [place], [place] [address]    Report Status PENDING  Incomplete     Time coordinating discharge: Over 30 minutes  SIGNED:   Cordelia Poche, MD [facility] 02/14/2017, 12:51 PM Pager [phone]  If 7PM-7AM, please contact night-coverage [email or web address] Password [email or web address]

## 2017-02-14 NOTE — Final Consult Note (Signed)
Consultant Final Sign-Off Note    Assessment/Final recommendations  Maria Chavez is a 51 y.o. female followed by me for Left obstructing stone, poorly controlled pain, and concern for infection.  She is now status post left ureteral stent placement.  Her urine cultures and blood cultures have returned as no growth to date.  She clearly was not septic, she likely did have a small UTI that was not captured prior to her receiving antibiotics.  At this point, the patient is going to be discharged with follow-up with me for ureteroscopy in 2 weeks.   Wound care (if applicable):    Diet at discharge: ad lib   Activity at discharge: ad lib   Follow-up appointment:    weeks for surgery, I will contact her with a surgery time and date  Pending results:  Unresulted Labs    Start     Ordered   02/20/17 0500  Creatinine, serum  (enoxaparin (LOVENOX)    CrCl >/= 30 ml/min)  Weekly,   R    Comments:  while on enoxaparin therapy    02/13/17 1443   02/13/17 0921  Culture, blood (routine x 2)  BLOOD CULTURE X 2,   R     02/13/17 [id number]       Medication recommendations: Nitrofurantoin 100 mg twice a day 7 days   Other recommendations:    Thank you for allowing [place] to participate in the care of your patient!  Please consult [place] again if you have further needs for your patient.  Ardis Hughs 02/14/2017 12:40 PM    Subjective     Objective  Vital signs in last 24 hours: Temp:  [98.7 F (37.1 C)-98.9 F (37.2 C)] 98.8 F (37.1 C) (02/25 0509) Pulse Rate:  [96-99] 96 (02/25 0509) Resp:  [18] 18 (02/25 0509) BP: (110-130)/(85-90) 130/89 (02/25 0509) SpO2:  [93 %-100 %] 96 % (02/25 0509)  General: Patient appears significantly better than previous encounter yesterday Foley catheter is draining clear yellow urine   Pertinent labs and Studies:  Recent Labs  02/12/17 2056 02/13/17 0511  WBC 13.0* 11.9*  HGB 14.3 13.2  HCT 40.0 37.0   BMET  Recent Labs   02/12/17 2056 02/13/17 0511  NA 129* 135  K 3.1* 3.3*  CL 98* 104  CO2 22 22  GLUCOSE 127* 112*  BUN 23* 23*  CREATININE 1.76* 1.53*  CALCIUM 8.5* 8.4*   No results for input(s): LABURIN in the last 72 hours. Results for orders placed or performed during the hospital encounter of 02/12/17  Urine culture     Status: None   Collection Time: 02/12/17  8:18 PM  Result Value Ref Range Status   Specimen Description URINE, CLEAN CATCH  Final   Special Requests NONE  Final   Culture   Final    NO GROWTH Performed at [facility], [place] [address] [place], [place] [address]    Report Status 02/14/2017 FINAL  Final  Surgical pcr screen     Status: None   Collection Time: 02/13/17  4:33 AM  Result Value Ref Range Status   MRSA, PCR NEGATIVE NEGATIVE Final   Staphylococcus aureus NEGATIVE NEGATIVE Final    Comment:        The Xpert SA Assay (FDA approved for NASAL specimens in patients over 44 years of age), is one component of a comprehensive surveillance program.  Test performance has been validated by [facility] for patients greater than or equal to 1  year old. It is not intended to diagnose infection nor to guide or monitor treatment.   Urine culture     Status: None (Preliminary result)   Collection Time: 02/13/17  8:59 AM  Result Value Ref Range Status   Specimen Description CYSTOSCOPY  Final   Special Requests NONE  Final   Culture   Final    CULTURE REINCUBATED FOR BETTER GROWTH Performed at [facility], [place] [address] [place], [place] [address]    Report Status PENDING  Incomplete    Imaging: No results found.

## 2017-02-15 ENCOUNTER — Encounter (HOSPITAL_COMMUNITY): Payer: Self-pay | Admitting: Urology

## 2017-02-16 LAB — URINE CULTURE

## 2017-02-18 ENCOUNTER — Encounter (HOSPITAL_COMMUNITY): Payer: Self-pay | Admitting: Emergency Medicine

## 2017-02-18 ENCOUNTER — Inpatient Hospital Stay (HOSPITAL_COMMUNITY)
Admission: EM | Admit: 2017-02-18 | Discharge: 2017-02-23 | DRG: 872 | Disposition: A | Discharge status: Home / Self Care | Payer: Self-pay | Attending: Family Medicine | Admitting: Family Medicine

## 2017-02-18 DIAGNOSIS — N200 Calculus of kidney: Secondary | ICD-10-CM

## 2017-02-18 DIAGNOSIS — N10 Acute tubulo-interstitial nephritis: Secondary | ICD-10-CM | POA: Diagnosis present

## 2017-02-18 DIAGNOSIS — R112 Nausea with vomiting, unspecified: Secondary | ICD-10-CM | POA: Diagnosis present

## 2017-02-18 DIAGNOSIS — D259 Leiomyoma of uterus, unspecified: Secondary | ICD-10-CM | POA: Diagnosis present

## 2017-02-18 DIAGNOSIS — Z87442 Personal history of urinary calculi: Secondary | ICD-10-CM

## 2017-02-18 DIAGNOSIS — Z79899 Other long term (current) drug therapy: Secondary | ICD-10-CM

## 2017-02-18 DIAGNOSIS — Z79891 Long term (current) use of opiate analgesic: Secondary | ICD-10-CM

## 2017-02-18 DIAGNOSIS — E876 Hypokalemia: Secondary | ICD-10-CM | POA: Diagnosis present

## 2017-02-18 DIAGNOSIS — R109 Unspecified abdominal pain: Secondary | ICD-10-CM | POA: Diagnosis present

## 2017-02-18 DIAGNOSIS — Z9049 Acquired absence of other specified parts of digestive tract: Secondary | ICD-10-CM

## 2017-02-18 DIAGNOSIS — N12 Tubulo-interstitial nephritis, not specified as acute or chronic: Secondary | ICD-10-CM | POA: Diagnosis present

## 2017-02-18 DIAGNOSIS — A419 Sepsis, unspecified organism: Principal | ICD-10-CM | POA: Diagnosis present

## 2017-02-18 DIAGNOSIS — K5090 Crohn's disease, unspecified, without complications: Secondary | ICD-10-CM | POA: Diagnosis present

## 2017-02-18 DIAGNOSIS — R319 Hematuria, unspecified: Secondary | ICD-10-CM

## 2017-02-18 DIAGNOSIS — F17200 Nicotine dependence, unspecified, uncomplicated: Secondary | ICD-10-CM | POA: Diagnosis present

## 2017-02-18 LAB — CULTURE, BLOOD (ROUTINE X 2)
CULTURE: NO GROWTH
Culture: NO GROWTH

## 2017-02-18 LAB — CBC
HCT: 34.6 % — ABNORMAL LOW (ref 36.0–46.0)
HEMOGLOBIN: 12.2 g/dL (ref 12.0–15.0)
MCH: 28.9 pg (ref 26.0–34.0)
MCHC: 35.3 g/dL (ref 30.0–36.0)
MCV: 82 fL (ref 78.0–100.0)
PLATELETS: 246 10*3/uL (ref 150–400)
RBC: 4.22 MIL/uL (ref 3.87–5.11)
RDW: 13.8 % (ref 11.5–15.5)
WBC: 22.3 10*3/uL — ABNORMAL HIGH (ref 4.0–10.5)

## 2017-02-18 LAB — URINALYSIS, ROUTINE W REFLEX MICROSCOPIC
Bilirubin Urine: NEGATIVE
GLUCOSE, UA: NEGATIVE mg/dL
Ketones, ur: 20 mg/dL — AB
Nitrite: NEGATIVE
PH: 6 (ref 5.0–8.0)
Protein, ur: 30 mg/dL — AB
SPECIFIC GRAVITY, URINE: 1.005 (ref 1.005–1.030)

## 2017-02-18 MED ORDER — ONDANSETRON 4 MG PO TBDP
4.0000 mg | ORAL_TABLET | Freq: Once | ORAL | Status: AC | PRN
  Administered 2017-02-18: 4 mg via ORAL
  Filled 2017-02-18: qty 1

## 2017-02-18 NOTE — ED Triage Notes (Signed)
Pt was released from the hospital Sunday for kidney stones and infection.  Began to feel a little better yesterday and was able to tolerate food.  Since 0330 this morning she has been "sick as a dog".  Endorses nausea and vomiting.  Afebrile on assessment.  Ambulatory to triage. A&O x4.

## 2017-02-18 NOTE — ED Notes (Signed)
UNABLE TO COLLECT BLOOD SAMPLES 

## 2017-02-19 ENCOUNTER — Emergency Department (HOSPITAL_COMMUNITY): Payer: Self-pay

## 2017-02-19 ENCOUNTER — Encounter (HOSPITAL_COMMUNITY): Payer: Self-pay | Admitting: Emergency Medicine

## 2017-02-19 DIAGNOSIS — N12 Tubulo-interstitial nephritis, not specified as acute or chronic: Secondary | ICD-10-CM | POA: Diagnosis present

## 2017-02-19 DIAGNOSIS — N10 Acute tubulo-interstitial nephritis: Secondary | ICD-10-CM | POA: Diagnosis present

## 2017-02-19 LAB — COMPREHENSIVE METABOLIC PANEL
ALK PHOS: 156 U/L — AB (ref 38–126)
ALT: 34 U/L (ref 14–54)
ANION GAP: 8 (ref 5–15)
AST: 30 U/L (ref 15–41)
Albumin: 2.8 g/dL — ABNORMAL LOW (ref 3.5–5.0)
BUN: 11 mg/dL (ref 6–20)
CALCIUM: 9.2 mg/dL (ref 8.9–10.3)
CO2: 29 mmol/L (ref 22–32)
CREATININE: 1.01 mg/dL — AB (ref 0.44–1.00)
Chloride: 95 mmol/L — ABNORMAL LOW (ref 101–111)
Glucose, Bld: 111 mg/dL — ABNORMAL HIGH (ref 65–99)
Potassium: 2.7 mmol/L — CL (ref 3.5–5.1)
Sodium: 132 mmol/L — ABNORMAL LOW (ref 135–145)
TOTAL PROTEIN: 6.8 g/dL (ref 6.5–8.1)
Total Bilirubin: 2 mg/dL — ABNORMAL HIGH (ref 0.3–1.2)

## 2017-02-19 LAB — LIPASE, BLOOD: LIPASE: 72 U/L — AB (ref 11–51)

## 2017-02-19 LAB — I-STAT TROPONIN, ED: TROPONIN I, POC: 0 ng/mL (ref 0.00–0.08)

## 2017-02-19 LAB — RAPID URINE DRUG SCREEN, HOSP PERFORMED
Amphetamines: NOT DETECTED
Barbiturates: NOT DETECTED
Benzodiazepines: NOT DETECTED
Cocaine: NOT DETECTED
Opiates: NOT DETECTED
Tetrahydrocannabinol: POSITIVE — AB

## 2017-02-19 LAB — POTASSIUM
POTASSIUM: 2.6 mmol/L — AB (ref 3.5–5.1)
POTASSIUM: 3.5 mmol/L (ref 3.5–5.1)

## 2017-02-19 LAB — MAGNESIUM
MAGNESIUM: 1.7 mg/dL (ref 1.7–2.4)
MAGNESIUM: 1.7 mg/dL (ref 1.7–2.4)
MAGNESIUM: 2.2 mg/dL (ref 1.7–2.4)

## 2017-02-19 LAB — GLUCOSE, CAPILLARY: Glucose-Capillary: 139 mg/dL — ABNORMAL HIGH (ref 65–99)

## 2017-02-19 LAB — PREGNANCY, URINE: Preg Test, Ur: NEGATIVE

## 2017-02-19 MED ORDER — ACETAMINOPHEN 325 MG PO TABS
650.0000 mg | ORAL_TABLET | Freq: Four times a day (QID) | ORAL | Status: DC | PRN
  Administered 2017-02-19: 650 mg via ORAL
  Filled 2017-02-19: qty 2

## 2017-02-19 MED ORDER — LEVALBUTEROL HCL 0.63 MG/3ML IN NEBU: 0.6300 mg | INHALATION_SOLUTION | Freq: Four times a day (QID) | RESPIRATORY_TRACT | Status: DC | PRN

## 2017-02-19 MED ORDER — TAMSULOSIN HCL 0.4 MG PO CAPS
0.4000 mg | ORAL_CAPSULE | Freq: Every day | ORAL | Status: DC | PRN
  Administered 2017-02-20: 0.4 mg via ORAL
  Filled 2017-02-19: qty 1

## 2017-02-19 MED ORDER — DEXTROSE 5 % IV SOLN
1.0000 g | INTRAVENOUS | Status: DC
  Administered 2017-02-19 – 2017-02-21 (×3): 1 g via INTRAVENOUS
  Filled 2017-02-19 (×3): qty 10

## 2017-02-19 MED ORDER — ACETAMINOPHEN 650 MG RE SUPP: 650.0000 mg | Freq: Four times a day (QID) | RECTAL | Status: DC | PRN

## 2017-02-19 MED ORDER — SODIUM CHLORIDE 0.9 % IV BOLUS (SEPSIS)
1000.0000 mL | Freq: Once | INTRAVENOUS | Status: AC
  Administered 2017-02-19: 1000 mL via INTRAVENOUS

## 2017-02-19 MED ORDER — PHENAZOPYRIDINE HCL 100 MG PO TABS
100.0000 mg | ORAL_TABLET | Freq: Three times a day (TID) | ORAL | Status: AC | PRN
  Filled 2017-02-19: qty 1

## 2017-02-19 MED ORDER — KCL IN DEXTROSE-NACL 40-5-0.9 MEQ/L-%-% IV SOLN
INTRAVENOUS | Status: DC
  Administered 2017-02-19 – 2017-02-20 (×5): via INTRAVENOUS
  Filled 2017-02-19 (×6): qty 1000

## 2017-02-19 MED ORDER — ONDANSETRON HCL 4 MG PO TABS: 4.0000 mg | ORAL_TABLET | Freq: Four times a day (QID) | ORAL | Status: DC | PRN

## 2017-02-19 MED ORDER — MAGNESIUM SULFATE IN D5W 1-5 GM/100ML-% IV SOLN
1.0000 g | Freq: Once | INTRAVENOUS | Status: AC
  Administered 2017-02-19: 1 g via INTRAVENOUS
  Filled 2017-02-19: qty 100

## 2017-02-19 MED ORDER — FENTANYL CITRATE (PF) 100 MCG/2ML IJ SOLN
12.5000 ug | INTRAMUSCULAR | Status: DC | PRN
  Administered 2017-02-19 – 2017-02-20 (×3): 12.5 ug via INTRAVENOUS
  Filled 2017-02-19 (×3): qty 2

## 2017-02-19 MED ORDER — KETOROLAC TROMETHAMINE 30 MG/ML IJ SOLN
30.0000 mg | Freq: Once | INTRAMUSCULAR | Status: AC
  Administered 2017-02-19: 30 mg via INTRAVENOUS
  Filled 2017-02-19: qty 1

## 2017-02-19 MED ORDER — OXYBUTYNIN CHLORIDE 5 MG PO TABS
5.0000 mg | ORAL_TABLET | Freq: Three times a day (TID) | ORAL | Status: DC | PRN
  Administered 2017-02-19: 5 mg via ORAL
  Filled 2017-02-19: qty 1

## 2017-02-19 MED ORDER — POTASSIUM CHLORIDE CRYS ER 20 MEQ PO TBCR
40.0000 meq | EXTENDED_RELEASE_TABLET | Freq: Once | ORAL | Status: AC
  Administered 2017-02-19: 40 meq via ORAL
  Filled 2017-02-19: qty 2

## 2017-02-19 MED ORDER — TAMSULOSIN HCL 0.4 MG PO CAPS
0.4000 mg | ORAL_CAPSULE | Freq: Every day | ORAL | Status: DC
  Administered 2017-02-19 – 2017-02-23 (×5): 0.4 mg via ORAL
  Filled 2017-02-19 (×5): qty 1

## 2017-02-19 MED ORDER — OXYCODONE HCL 5 MG PO TABS
5.0000 mg | ORAL_TABLET | ORAL | Status: DC | PRN
  Administered 2017-02-19 – 2017-02-20 (×6): 5 mg via ORAL
  Filled 2017-02-19 (×6): qty 1

## 2017-02-19 MED ORDER — ONDANSETRON HCL 4 MG/2ML IJ SOLN
4.0000 mg | Freq: Four times a day (QID) | INTRAMUSCULAR | Status: DC | PRN
  Administered 2017-02-19 – 2017-02-22 (×4): 4 mg via INTRAVENOUS
  Filled 2017-02-19 (×4): qty 2

## 2017-02-19 MED ORDER — ONDANSETRON HCL 4 MG/2ML IJ SOLN
4.0000 mg | Freq: Once | INTRAMUSCULAR | Status: AC
  Administered 2017-02-19: 4 mg via INTRAVENOUS
  Filled 2017-02-19: qty 2

## 2017-02-19 MED ORDER — ENOXAPARIN SODIUM 40 MG/0.4ML ~~LOC~~ SOLN
40.0000 mg | SUBCUTANEOUS | Status: DC
  Administered 2017-02-19 – 2017-02-22 (×4): 40 mg via SUBCUTANEOUS
  Filled 2017-02-19 (×5): qty 0.4

## 2017-02-19 MED ORDER — KETOROLAC TROMETHAMINE 30 MG/ML IJ SOLN: 30.0000 mg | Freq: Once | INTRAMUSCULAR | Status: DC

## 2017-02-19 MED ORDER — DEXTROSE 5 % IV SOLN
1.0000 g | Freq: Once | INTRAVENOUS | Status: AC
  Administered 2017-02-19: 1 g via INTRAVENOUS
  Filled 2017-02-19: qty 10

## 2017-02-19 MED ORDER — MAGNESIUM SULFATE 2 GM/50ML IV SOLN
2.0000 g | Freq: Once | INTRAVENOUS | Status: AC
  Administered 2017-02-19: 2 g via INTRAVENOUS
  Filled 2017-02-19: qty 50

## 2017-02-19 MED ORDER — POTASSIUM CHLORIDE CRYS ER 20 MEQ PO TBCR
40.0000 meq | EXTENDED_RELEASE_TABLET | Freq: Two times a day (BID) | ORAL | Status: DC
  Administered 2017-02-19 (×2): 40 meq via ORAL
  Filled 2017-02-19 (×2): qty 2

## 2017-02-19 NOTE — ED Notes (Signed)
Pt transported to Radiology 

## 2017-02-19 NOTE — Progress Notes (Signed)
Patient oral temp reading 102.7 Tylenol given.  MD made aware.

## 2017-02-19 NOTE — ED Notes (Signed)
Potassium 2.7

## 2017-02-19 NOTE — ED Notes (Signed)
Pt placed on cardiac monitor d/t hypokalemia.

## 2017-02-19 NOTE — Consult Note (Signed)
New Urology Consult Note   Requesting Attending Physician:  Maria Patience, MD Service Providing Consult: Urology Consulting Attending: McKenzie  Assessment:  Patient is a 51 y.o. female with probable left pyelonephritis s/p left ureteral stent placement 02/13/17 for left obstructing ureteral calculus in setting of Klebsiella UTI. Patient was likely discharged on inadequate antibiotic treatment with nitrofurantoin after previous admission. Nitrofurantoin has poor tissue penetrance and would not adequately cover pyelonephritis in setting of complicated UTI. WBC 22. Ureteral stent in good position on Ct.   Recommendations: 1. Admission for appropriate antibiotic coverage with urologic consultation 2. Recommend Foley catheter decompression of bladder for at least 24 hours while on appropriate antibiotics (would use 2/24 urine culture to tailor antibiotic therapy) 3. Follow up 3/1 urine culture 4. No further urologic procedure or intervention required as ureteral stent in appropriate position 5. Recommend ureteral stent discomfort meds - flomax 0.4mg  qd PRN stent discomfort, ditropan 5mg  TID PRN bladder spasm, pyridium 100mg  TID PRN dysuria (for 3 days) 6. NSAIDs/toradol recommended for pain control if medically appropriate  Thank you for this consult. Please contact the urology consult pager with any further questions/concerns. Sharmaine Base, MD Urology Surgical Resident   HPI  Reason for Consult:  Pyelonephritis s/p ureteral stent placement  Maria Chavez is seen in consultation for reasons noted above at the request of Maria Patience, MD.  This is a 51 yo patient with a history of Crohn's disease, fibroid uterus & nephrolithiasis who recently underwent left ureteral stent placement on 02/13/17 with Dr. Louis Meckel for left obstructing ureteral stone in setting of UTI. She was discharged 02/14/17 on macrobid after receiving rocephin. She returned to the ER last evening with nausea  and emesis x 1 day with flank pain relieved by percocet. No fevers and vitals stable here, however leukocytosis to 22.3. Cr 1.01. K 2.6.   CT shows ureteral stent in appropriate position with resolved hydro, 30mm stone in left renal pelvis and smaller 36mm stone in lower pole left kidney. Left kidney appears edematous, and while CT without contrast, suspicious for pyelonephritis. Previous urine culture from 2/24 showed Klebsiella pn with sensitivity profile that did not include nitrofurantoin.   Urinalysis 3/1 shows rare bacteria, large leuks, negative nitrite, TNTC WBC (albeit in presence of ureteral stent).  Tells me that she's had fevers at home, chills, night sweats; emesis started last evening. Significant left lower back pain. Has had multiple ureteral stents in past and is s/p ESWLs and never had pain like this. She understands rationale for Foley catheter and is agreeable to this.   Past Medical History: Past Medical History:  Diagnosis Date  . Crohn's disease ([place])   . Fibroids   . Kidney stones   . Kidney stones     Past Surgical History:  Past Surgical History:  Procedure Laterality Date  . CHOLECYSTECTOMY    . COLON SURGERY    . CYSTOSCOPY W/ URETERAL STENT PLACEMENT Left 02/13/2017   Procedure: CYSTOSCOPY WITH LEFT RETROGRADE PYELOGRAM/LEFT URETERAL STENT PLACEMENT;  Surgeon: Ardis Hughs, MD;  Location: [facility];  Service: Urology;  Laterality: Left;  . TUBAL LIGATION      Medication: Current Facility-Administered Medications  Medication Dose Route Frequency Provider Last Rate Last Dose  . dextrose 5 % and 0.9 % NaCl with KCl 40 mEq/L infusion   Intravenous Continuous April Palumbo, MD 125 mL/hr at 02/19/17 0414    . ketorolac (TORADOL) 30 MG/ML injection 30 mg  30 mg Intravenous Once April Palumbo, MD  Current Outpatient Prescriptions  Medication Sig Dispense Refill  . nitrofurantoin, macrocrystal-monohydrate, (MACROBID) 100 MG capsule Take 1 capsule (100 mg  total) by mouth 2 (two) times daily. 14 capsule 0  . oxyCODONE-acetaminophen (PERCOCET/ROXICET) 5-325 MG tablet Take 1 tablet by mouth every 4 (four) hours as needed for moderate pain or severe pain. 30 tablet 0  . tamsulosin (FLOMAX) 0.4 MG CAPS capsule Take 0.4 mg by mouth daily.       Allergies: No Known Allergies  Social History: Social History  Substance Use Topics  . Smoking status: Current Every Day Smoker  . Smokeless tobacco: Never Used     Comment: has not smoke since she was in the hospital  . Alcohol use No    Family History No family history on file.  Review of Systems 10 systems were reviewed and are negative except as noted specifically in the HPI.  Objective   Vital signs in last 24 hours: BP 117/68 (BP Location: Left Arm)   Pulse 85   Temp 98.4 F (36.9 C) (Oral)   Resp 20   Ht 5\' 3"  (1.6 m)   Wt 61.2 kg (135 lb)   SpO2 99%   BMI 23.91 kg/m   Intake/Output last 3 shifts: I/O last 3 completed shifts: In: 2098 [IV Piggyback:2098] Out: -   Physical Exam General: NAD, A&O, resting, appropriate HEENT: [place]/AT, EOMI, MMM Pulmonary: Normal work of breathing on RA Cardiovascular: Regular rate & rhythm, HDS, adequate peripheral perfusion Abdomen: soft, NTTP, nondistended, no suprapubic fullness or tenderness GU: no Foley present, significant left CVA tenderness and CVAT referred to the left when assessed on the right Extremities: warm and well perfused, no edema Neuro: Appropriate, no focal neurological deficits  Most Recent Labs: Lab Results  Component Value Date   WBC 22.3 (H) 02/18/2017   HGB 12.2 02/18/2017   HCT 34.6 (L) 02/18/2017   PLT 246 02/18/2017    Lab Results  Component Value Date   NA 132 (L) 02/18/2017   K 2.6 (LL) 02/19/2017   CL 95 (L) 02/18/2017   CO2 29 02/18/2017   BUN 11 02/18/2017   CREATININE 1.01 (H) 02/18/2017   CALCIUM 9.2 02/18/2017   MG 1.7 02/18/2017    Lab Results  Component Value Date   ALKPHOS 156 (H)  02/18/2017   BILITOT 2.0 (H) 02/18/2017   PROT 6.8 02/18/2017   ALBUMIN 2.8 (L) 02/18/2017   ALT 34 02/18/2017   AST 30 02/18/2017    No results found for: INR, APTT   Urine Culture: 3/1 pending  2/24 - 60k Klebsiella pn Culture 60,000 COLONIES/mL KLEBSIELLA PNEUMONIAE    Report Status 02/16/2017 FINAL   Organism ID, Bacteria KLEBSIELLA PNEUMONIAE    Resulting Agency [facility]  Susceptibility    Klebsiella pneumoniae    MIC    AMPICILLIN >=32 RESIST... Resistant    AMPICILLIN/SULBACTAM 16 INTERMED... Intermediate    CEFAZOLIN <=4 SENSITIVE "><=4 SENSITIVE  Sensitive    CEFEPIME <=1 SENSITIVE "><=1 SENSITIVE  Sensitive    CEFTAZIDIME <=1 SENSITIVE "><=1 SENSITIVE  Sensitive    CEFTRIAXONE <=1 SENSITIVE "><=1 SENSITIVE  Sensitive    CIPROFLOXACIN <=0.25 SENSITIVE "><=0.25 SENS... Sensitive    Extended ESBL NEGATIVE  Sensitive    GENTAMICIN <=1 SENSITIVE "><=1 SENSITIVE  Sensitive    IMIPENEM <=0.25 SENSITIVE "><=0.25 SENS... Sensitive    PIP/TAZO <=4 SENSITIVE "><=4 SENSITIVE  Sensitive    TRIMETH/SULFA <=20 SENSITIVE "><=20 SENSIT... Sensitive         IMAGING: Dg Chest  2 View  Result Date: 02/19/2017 CLINICAL DATA:  Decreased O2 saturation.  Initial encounter. EXAM: CHEST  2 VIEW COMPARISON:  None. FINDINGS: The lungs are well-aerated. Patchy basilar airspace opacification is suggested on the lateral view, raising concern for pneumonia. There is no evidence of pleural effusion or pneumothorax. The heart is borderline enlarged. No acute osseous abnormalities are seen. IMPRESSION: Patchy basilar airspace opacification suggested on the lateral view, raising concern for pneumonia. Borderline cardiomegaly. Electronically Signed   By: Garald Balding M.D.   On: 02/19/2017 02:49   Ct Renal Stone Study  Result Date: 02/19/2017 CLINICAL DATA:  Left flank pain and hematuria. EXAM: CT ABDOMEN AND PELVIS WITHOUT CONTRAST TECHNIQUE: Multidetector CT imaging of the abdomen and pelvis was  performed following the standard protocol without IV contrast. COMPARISON:  CT 8 days prior 02/12/2017 FINDINGS: Lower chest: Small bilateral pleural effusions. Associated bibasilar opacities, only partially included. Hepatobiliary: No evidence of focal lesion allowing for lack contrast. The liver appears prominent size. Clips in the gallbladder fossa postcholecystectomy. No biliary dilatation. Pancreas: No ductal dilatation or inflammation. Spleen: Upper normal in size spanning 12.8 cm. Adrenals/Urinary Tract: Left ureteral stent in place, proximal pigtail in the renal pelvis, distal pigtail in the bladder. There is a 9 mm stone fragment adjacent to the proximal loop in the renal pelvis. 4 mm stone in the left lower left kidney. Left kidney remains enlarged and edematous with perinephric edema. There is no residual renal collecting system dilatation. Right kidney is normal in size without urolithiasis. Urinary bladder is minimally distended without wall thickening. Stomach/Bowel: Small hiatal hernia. Enteric sutures in the ascending colon likely post ileocecectomy. Enteric sutures in the sigmoid colon. No evidence of bowel inflammation or obstruction. There is stool throughout the colon. Vascular/Lymphatic: Aortic atherosclerosis without aneurysm. Small retroperitoneal nodes on the left. Reproductive: Uterus is mildly bulbous in appearance. No evidence of adnexal mass. Other: No free air, free fluid, or intra-abdominal fluid collection. Musculoskeletal: There are no acute or suspicious osseous abnormalities. IMPRESSION: 1. Placement of a left ureteral stent with resolved hydronephrosis. A 62mm stone fragment persists in the renal pelvis with smaller fragment in the lower left kidney. Persistent edematous appearance of the left kidney suspicious for concurrent pyelonephritis. 2. Bilateral pleural effusions and bibasilar opacities at the lung bases are only partially included. Electronically Signed   By: Jeb Levering M.D.   On: 02/19/2017 03:14

## 2017-02-19 NOTE — H&P (Addendum)
[facility] History and Physical  Maria Chavez [id number] DOB: [birth year] DOA: 02/18/2017  Referring physician: *  PCP: No PCP Per Patient   Chief Complaint:    HPI:   51 year old female with a history of Crohn's disease, nephrolithiasis,left pyelonephritis , s/p left ureteral stent placement 02/13/17 for left obstructing ureteral calculus in setting of Klebsiella UTI.  , discharged on nitrofurantoin on 2/25, who presents to the ER because of flank pain, nausea, abdominal pain, inability to keep anything down. Patient states that the pain is not relieved by Percocet. Patient felt better for a couple of days after discharge, but subsequently started experiencing the above symptoms along with generalized weakness and inability to do anything at home. She states that she's been compliant with her antibiotic treatment. She denies any diarrhea ED course BP 116/83 (BP Location: Left Arm)   Pulse 95   Temp 98.4 F (36.9 C) (Oral)   Resp 20   Potassium 2.6, sodium 132, creatinine 1.01, white blood cell count 22.3, UA positive, pregnancy test negative, CT stone protocol consistent with left-sided pyelonephritis Patient is being admitted for treatment of her pyelonephritis       Review of Systems: negative for the following  Constitutional: Denies fever, chills, diaphoresis, appetite change and fatigue.  HEENT: Denies photophobia, eye pain, redness, hearing loss, ear pain, congestion, sore throat, rhinorrhea, sneezing, mouth sores, trouble swallowing, neck pain, neck stiffness and tinnitus.  Respiratory: Denies SOB, DOE, cough, chest tightness, and wheezing.  Cardiovascular: Denies chest pain, palpitations and leg swelling.  Gastrointestinal:  Positive for nausea and vomiting abdominal pain, negative for diarrhea, constipation, blood in stool and abdominal distention.  Genitourinary: Denies dysuria, urgency, frequency, hematuria, flank pain and difficulty urinating.   Musculoskeletal: Denies myalgias, back pain, joint swelling, arthralgias and gait problem.  Skin: Denies pallor, rash and wound.  Neurological: Denies dizziness, seizures, syncope, weakness, light-headedness, numbness and headaches.  Hematological: Denies adenopathy. Easy bruising, personal or family bleeding history  Psychiatric/Behavioral: Denies suicidal ideation, mood changes, confusion, nervousness, sleep disturbance and agitation       Past Medical History:  Diagnosis Date  . Crohn's disease ([place])   . Fibroids   . Kidney stones   . Kidney stones      Past Surgical History:  Procedure Laterality Date  . CHOLECYSTECTOMY    . COLON SURGERY    . CYSTOSCOPY W/ URETERAL STENT PLACEMENT Left 02/13/2017   Procedure: CYSTOSCOPY WITH LEFT RETROGRADE PYELOGRAM/LEFT URETERAL STENT PLACEMENT;  Surgeon: Ardis Hughs, MD;  Location: [facility];  Service: Urology;  Laterality: Left;  . TUBAL LIGATION        Social History:  reports that she has been smoking.  She has never used smokeless tobacco. She reports that she does not drink alcohol or use drugs.    No Known Allergies      FAMILY HISTORY  When questioned  Directly-patient reports  No family history of HTN, CVA ,DIABETES, TB, Cancer CAD, Bleeding Disorders, Sickle Cell, diabetes, anemia, asthma,   Prior to Admission medications   Medication Sig Start Date End Date Taking? Authorizing Provider  nitrofurantoin, macrocrystal-monohydrate, (MACROBID) 100 MG capsule Take 1 capsule (100 mg total) by mouth 2 (two) times daily. 02/14/17  Yes Mariel Aloe, MD  oxyCODONE-acetaminophen (PERCOCET/ROXICET) 5-325 MG tablet Take 1 tablet by mouth every 4 (four) hours as needed for moderate pain or severe pain. 02/14/17  Yes Mariel Aloe, MD  tamsulosin (FLOMAX) 0.4 MG CAPS capsule Take 0.4 mg by mouth daily.  Yes Historical Provider, MD     Physical Exam: Vitals:   02/19/17 0116 02/19/17 0455 02/19/17 0714 02/19/17 0737  BP:  123/74 113/74 117/68 118/72  Pulse: 92 87 85 83  Resp: 20 20 20 20   Temp:      TempSrc:      SpO2: 92% 98% 99% 98%  Weight:      Height:          Constitutional: NAD, calm, comfortable Vitals:   02/19/17 0116 02/19/17 0455 02/19/17 0714 02/19/17 0737  BP: 123/74 113/74 117/68 118/72  Pulse: 92 87 85 83  Resp: 20 20 20 20   Temp:      TempSrc:      SpO2: 92% 98% 99% 98%  Weight:      Height:       Eyes: PERRL, lids and conjunctivae normal ENMT: Mucous membranes are moist. Posterior pharynx clear of any exudate or lesions.Normal dentition.  Neck: normal, supple, no masses, no thyromegaly Respiratory: clear to auscultation bilaterally, no wheezing, no crackles. Normal respiratory effort. No accessory muscle use.  Cardiovascular: Regular rate and rhythm, no murmurs / rubs / gallops. No extremity edema. 2+ pedal pulses. No carotid bruits.  Abdomen: Left-sided flank tenderness, no masses palpated. No hepatosplenomegaly. Bowel sounds positive.  Musculoskeletal: no clubbing / cyanosis. No joint deformity upper and lower extremities. Good ROM, no contractures. Normal muscle tone.  Skin: no rashes, lesions, ulcers. No induration Neurologic: CN 2-12 grossly intact. Sensation intact, DTR normal. Strength 5/5 in all 4.  Psychiatric: Normal judgment and insight. Alert and oriented x 3. Normal mood.     Labs on Admission: I have personally reviewed following labs and imaging studies  CBC:  Recent Labs Lab 02/12/17 2056 02/13/17 0511 02/18/17 2307  WBC 13.0* 11.9* 22.3*  NEUTROABS 12.1*  --   --   HGB 14.3 13.2 12.2  HCT 40.0 37.0 34.6*  MCV 83.7 82.4 82.0  PLT 106* 92* 0000000    Basic Metabolic Panel:  Recent Labs Lab 02/12/17 2056 02/13/17 0511 02/18/17 2307 02/18/17 2312 02/19/17 0140  NA 129* 135 132*  --   --   K 3.1* 3.3* 2.7*  --  2.6*  CL 98* 104 95*  --   --   CO2 22 22 29   --   --   GLUCOSE 127* 112* 111*  --   --   BUN 23* 23* 11  --   --   CREATININE 1.76*  1.53* 1.01*  --   --   CALCIUM 8.5* 8.4* 9.2  --   --   MG  --   --   --  1.7  --     GFR: Estimated Creatinine Clearance: 55.1 mL/min (by C-G formula based on SCr of 1.01 mg/dL (H)).  Liver Function Tests:  Recent Labs Lab 02/12/17 2056 02/18/17 2307  AST 37 30  ALT 26 34  ALKPHOS 56 156*  BILITOT 0.8 2.0*  PROT 6.0* 6.8  ALBUMIN 3.0* 2.8*    Recent Labs Lab 02/12/17 2056 02/18/17 2307  LIPASE 11 72*   No results for input(s): AMMONIA in the last 168 hours.  Coagulation Profile: No results for input(s): INR, PROTIME in the last 168 hours. No results for input(s): DDIMER in the last 72 hours.  Cardiac Enzymes:  Recent Labs Lab 02/13/17 0943 02/13/17 1550 02/13/17 2125  TROPONINI 0.04* 0.03* 0.03*    BNP (last 3 results) No results for input(s): PROBNP in the last 8760 hours.  HbA1C: No  results for input(s): HGBA1C in the last 72 hours. No results found for: HGBA1C   CBG: No results for input(s): GLUCAP in the last 168 hours.  Lipid Profile: No results for input(s): CHOL, HDL, LDLCALC, TRIG, CHOLHDL, LDLDIRECT in the last 72 hours.  Thyroid Function Tests: No results for input(s): TSH, T4TOTAL, FREET4, T3FREE, THYROIDAB in the last 72 hours.  Anemia Panel: No results for input(s): VITAMINB12, FOLATE, FERRITIN, TIBC, IRON, RETICCTPCT in the last 72 hours.  Urine analysis:    Component Value Date/Time   COLORURINE YELLOW 02/18/2017 [place] 02/18/2017 2212   LABSPEC 1.005 02/18/2017 2212   PHURINE 6.0 02/18/2017 2212   GLUCOSEU NEGATIVE 02/18/2017 2212   HGBUR LARGE (A) 02/18/2017 2212   BILIRUBINUR NEGATIVE 02/18/2017 2212   KETONESUR 20 (A) 02/18/2017 2212   PROTEINUR 30 (A) 02/18/2017 2212   NITRITE NEGATIVE 02/18/2017 2212   LEUKOCYTESUR LARGE (A) 02/18/2017 2212    Sepsis Labs: @LABRCNTIP (procalcitonin:4,lacticidven:4) ) Recent Results (from the past 240 hour(s))  Urine culture     Status: None   Collection Time:  02/12/17  8:18 PM  Result Value Ref Range Status   Specimen Description URINE, CLEAN CATCH  Final   Special Requests NONE  Final   Culture   Final    NO GROWTH Performed at [facility], [place] [address] [place], [place] [address]    Report Status 02/14/2017 FINAL  Final  Surgical pcr screen     Status: None   Collection Time: 02/13/17  4:33 AM  Result Value Ref Range Status   MRSA, PCR NEGATIVE NEGATIVE Final   Staphylococcus aureus NEGATIVE NEGATIVE Final    Comment:        The Xpert SA Assay (FDA approved for NASAL specimens in patients over 53 years of age), is one component of a comprehensive surveillance program.  Test performance has been validated by [facility] for patients greater than or equal to 79 year old. It is not intended to diagnose infection nor to guide or monitor treatment.   Urine culture     Status: Abnormal   Collection Time: 02/13/17  8:59 AM  Result Value Ref Range Status   Specimen Description CYSTOSCOPY  Final   Special Requests NONE  Final   Culture 60,000 COLONIES/mL KLEBSIELLA PNEUMONIAE (A)  Final   Report Status 02/16/2017 FINAL  Final   Organism ID, Bacteria KLEBSIELLA PNEUMONIAE (A)  Final      Susceptibility   Klebsiella pneumoniae - MIC*    AMPICILLIN >=32 RESISTANT Resistant     CEFAZOLIN <=4 SENSITIVE Sensitive     CEFEPIME <=1 SENSITIVE Sensitive     CEFTAZIDIME <=1 SENSITIVE Sensitive     CEFTRIAXONE <=1 SENSITIVE Sensitive     CIPROFLOXACIN <=0.25 SENSITIVE Sensitive     GENTAMICIN <=1 SENSITIVE Sensitive     IMIPENEM <=0.25 SENSITIVE Sensitive     TRIMETH/SULFA <=20 SENSITIVE Sensitive     AMPICILLIN/SULBACTAM 16 INTERMEDIATE Intermediate     PIP/TAZO <=4 SENSITIVE Sensitive     Extended ESBL NEGATIVE Sensitive     * 60,000 COLONIES/mL KLEBSIELLA PNEUMONIAE  Culture, blood (routine x 2)     Status: None   Collection Time: 02/13/17  9:43 AM  Result Value Ref Range Status   Specimen Description BLOOD LEFT HAND  Final    Special Requests BOTTLES DRAWN AEROBIC AND ANAEROBIC 5 CC EACH  Final   Culture   Final    NO GROWTH 5 DAYS Performed at [facility]  [facility], [place] [address] [place], [place] [address]    Report Status 02/18/2017 FINAL  Final  Culture, blood (routine x 2)     Status: None   Collection Time: 02/13/17  9:43 AM  Result Value Ref Range Status   Specimen Description BLOOD RIGHT HAND  Final   Special Requests BOTTLES DRAWN AEROBIC AND ANAEROBIC 5CC EACH  Final   Culture   Final    NO GROWTH 5 DAYS Performed at [facility], [place] [address], [place], [place] [address]    Report Status 02/18/2017 FINAL  Final         Radiological Exams on Admission: Dg Chest 2 View  Result Date: 02/19/2017 CLINICAL DATA:  Decreased O2 saturation.  Initial encounter. EXAM: CHEST  2 VIEW COMPARISON:  None. FINDINGS: The lungs are well-aerated. Patchy basilar airspace opacification is suggested on the lateral view, raising concern for pneumonia. There is no evidence of pleural effusion or pneumothorax. The heart is borderline enlarged. No acute osseous abnormalities are seen. IMPRESSION: Patchy basilar airspace opacification suggested on the lateral view, raising concern for pneumonia. Borderline cardiomegaly. Electronically Signed   By: Garald Balding M.D.   On: 02/19/2017 02:49   Ct Renal Stone Study  Result Date: 02/19/2017 CLINICAL DATA:  Left flank pain and hematuria. EXAM: CT ABDOMEN AND PELVIS WITHOUT CONTRAST TECHNIQUE: Multidetector CT imaging of the abdomen and pelvis was performed following the standard protocol without IV contrast. COMPARISON:  CT 8 days prior 02/12/2017 FINDINGS: Lower chest: Small bilateral pleural effusions. Associated bibasilar opacities, only partially included. Hepatobiliary: No evidence of focal lesion allowing for lack contrast. The liver appears prominent size. Clips in the gallbladder fossa postcholecystectomy. No biliary dilatation. Pancreas: No ductal dilatation or  inflammation. Spleen: Upper normal in size spanning 12.8 cm. Adrenals/Urinary Tract: Left ureteral stent in place, proximal pigtail in the renal pelvis, distal pigtail in the bladder. There is a 9 mm stone fragment adjacent to the proximal loop in the renal pelvis. 4 mm stone in the left lower left kidney. Left kidney remains enlarged and edematous with perinephric edema. There is no residual renal collecting system dilatation. Right kidney is normal in size without urolithiasis. Urinary bladder is minimally distended without wall thickening. Stomach/Bowel: Small hiatal hernia. Enteric sutures in the ascending colon likely post ileocecectomy. Enteric sutures in the sigmoid colon. No evidence of bowel inflammation or obstruction. There is stool throughout the colon. Vascular/Lymphatic: Aortic atherosclerosis without aneurysm. Small retroperitoneal nodes on the left. Reproductive: Uterus is mildly bulbous in appearance. No evidence of adnexal mass. Other: No free air, free fluid, or intra-abdominal fluid collection. Musculoskeletal: There are no acute or suspicious osseous abnormalities. IMPRESSION: 1. Placement of a left ureteral stent with resolved hydronephrosis. A 50mm stone fragment persists in the renal pelvis with smaller fragment in the lower left kidney. Persistent edematous appearance of the left kidney suspicious for concurrent pyelonephritis. 2. Bilateral pleural effusions and bibasilar opacities at the lung bases are only partially included. Electronically Signed   By: Jeb Levering M.D.   On: 02/19/2017 03:14   Dg Chest 2 View  Result Date: 02/19/2017 CLINICAL DATA:  Decreased O2 saturation.  Initial encounter. EXAM: CHEST  2 VIEW COMPARISON:  None. FINDINGS: The lungs are well-aerated. Patchy basilar airspace opacification is suggested on the lateral view, raising concern for pneumonia. There is no evidence of pleural effusion or pneumothorax. The heart is borderline enlarged. No acute osseous  abnormalities are seen. IMPRESSION: Patchy basilar airspace opacification  suggested on the lateral view, raising concern for pneumonia. Borderline cardiomegaly. Electronically Signed   By: Garald Balding M.D.   On: 02/19/2017 02:49   Ct Renal Stone Study  Result Date: 02/19/2017 CLINICAL DATA:  Left flank pain and hematuria. EXAM: CT ABDOMEN AND PELVIS WITHOUT CONTRAST TECHNIQUE: Multidetector CT imaging of the abdomen and pelvis was performed following the standard protocol without IV contrast. COMPARISON:  CT 8 days prior 02/12/2017 FINDINGS: Lower chest: Small bilateral pleural effusions. Associated bibasilar opacities, only partially included. Hepatobiliary: No evidence of focal lesion allowing for lack contrast. The liver appears prominent size. Clips in the gallbladder fossa postcholecystectomy. No biliary dilatation. Pancreas: No ductal dilatation or inflammation. Spleen: Upper normal in size spanning 12.8 cm. Adrenals/Urinary Tract: Left ureteral stent in place, proximal pigtail in the renal pelvis, distal pigtail in the bladder. There is a 9 mm stone fragment adjacent to the proximal loop in the renal pelvis. 4 mm stone in the left lower left kidney. Left kidney remains enlarged and edematous with perinephric edema. There is no residual renal collecting system dilatation. Right kidney is normal in size without urolithiasis. Urinary bladder is minimally distended without wall thickening. Stomach/Bowel: Small hiatal hernia. Enteric sutures in the ascending colon likely post ileocecectomy. Enteric sutures in the sigmoid colon. No evidence of bowel inflammation or obstruction. There is stool throughout the colon. Vascular/Lymphatic: Aortic atherosclerosis without aneurysm. Small retroperitoneal nodes on the left. Reproductive: Uterus is mildly bulbous in appearance. No evidence of adnexal mass. Other: No free air, free fluid, or intra-abdominal fluid collection. Musculoskeletal: There are no acute or  suspicious osseous abnormalities. IMPRESSION: 1. Placement of a left ureteral stent with resolved hydronephrosis. A 42mm stone fragment persists in the renal pelvis with smaller fragment in the lower left kidney. Persistent edematous appearance of the left kidney suspicious for concurrent pyelonephritis. 2. Bilateral pleural effusions and bibasilar opacities at the lung bases are only partially included. Electronically Signed   By: Jeb Levering M.D.   On: 02/19/2017 03:14   Ct Renal Stone Study  Result Date: 02/12/2017 CLINICAL DATA:  Initial evaluation for acute left flank pain for 4 days. Per history, recent CT scan with IV contrast. EXAM: CT ABDOMEN AND PELVIS WITHOUT CONTRAST TECHNIQUE: Multidetector CT imaging of the abdomen and pelvis was performed following the standard protocol without IV contrast. COMPARISON:  None available. FINDINGS: Lower chest: Small layering left pleural effusion with associated atelectasis. Scattered atelectatic changes with trace effusion at the right lung base is well. Hepatobiliary: The liver demonstrates a normal unenhanced appearance. Gallbladder surgically absent. Prominence of the common bile duct like related to post cholecystectomy changes. Pancreas: Pancreas within normal limits. Spleen: Spleen within normal limits. Adrenals/Urinary Tract: Adrenal glands are normal. Left kidney is asymmetrically enlarged as compared to the right. Parenchymal contrast staining present within left kidney with secrete IV contrast material within the proximal left renal collecting system. There is an obstructive stone measuring approximately 12 mm at the left UPJ (series 5, image 36). No IV contrast material seen distally within the left ureter. Associated perinephric and periureteral fat stranding. Moderate to severe left hydronephrosis. No other definite stones within left kidney, although evaluation limited by presence of IV contrast. Small amount secrete IV contrast material present  within the right renal collecting system. No definite right-sided renal or ureteral calculi. No right-sided hydronephrosis or hydroureter. Bladder partially distended with secreted contrast material within the bladder lumen. Stomach/Bowel: Stomach within normal limits. No evidence for bowel obstruction. Appendix appears to  be surgically absent. Additional anastomotic suture present about the sigmoid colon. No acute inflammatory changes about the bowels. Scattered free fluid approximates the descending colon related to the obstructive process within the left kidney. Vascular/Lymphatic: Mild aortic atherosclerosis. No aneurysm. No adenopathy. Reproductive: Uterus and ovaries within normal limits. Other: No free air. Small amount of free fluid related to the obstructive process within the left kidney. Tiny fat containing paraumbilical hernia noted. Musculoskeletal: No acute osseus abnormality. No worrisome lytic or blastic osseous lesions. Central disc protrusion noted at L5-S1. IMPRESSION: 1. 12 mm obstructive left UPJ stone with secondary moderate to severe left hydronephrosis. Retained IV contrast material on board from recent CT. 2. Small layering left pleural effusion with associated atelectasis. 3. Central disc protrusion at L5-S1. Electronically Signed   By: Jeannine Boga M.D.   On: 02/12/2017 21:43      EKG: Independently reviewed.    Assessment/Plan Principal Problem:   Acute pyelonephritis Patient will be evaluated by urology Apparently nitrofurantoin was not the right choice of antibiotic for this patient Patient has been started on Rocephin Repeat urine culture to see if sensitivities of the same No further urologic procedure recommended at this time Follow results of blood culture  Recent acute kidney injury-creatinine now back to baseline, after stent placement   Nausea & vomiting-likely secondary to #1  Hypokalemia-replete aggressively, magnesium 1.7  DVT prophylaxis:  lovenox      Code Status OrdersFull code            consults called:  Family Communication: Admission, patients condition and plan of care including tests being ordered have been discussed with the patient  who indicates understanding and agree with the plan and Code Status  Admission status: inpatient    Disposition plan: Further plan will depend as patient's clinical course evolves and further radiologic and laboratory data become available. Likely home when stable   At the time of admission, it appears that the appropriate admission status for this patient is INPATIENT .Thisis judged to be reasonable and necessary in order to provide the required intensity of service to ensure the patient's safetygiven thepresenting symptoms, physical exam findings, and initial radiographic and laboratory data in the context of their chronic comorbidities.   Reyne Dumas MD [facility] Pager [phone]  If 7PM-7AM, please contact night-coverage [email or web address] Password [email or web address]  02/19/2017, 8:22 AM

## 2017-02-19 NOTE — ED Provider Notes (Signed)
[facility] DEPT Provider Note   CSN: [id number] Arrival date & time: 02/18/17  2039   By signing my name below, I, Delton Prairie, attest that this documentation has been prepared under the direction and in the presence of Emilly Lavey, MD  Electronically Signed: Delton Prairie, ED Scribe. 02/19/17. 1:43 AM.   History   Chief Complaint Chief Complaint  Patient presents with  . Flank Pain  . Emesis   The history is provided by the patient. No language interpreter was used.  Flank Pain  This is a new problem. The current episode started yesterday. The problem has not changed since onset.Pertinent negatives include no chest pain. Nothing aggravates the symptoms. Nothing relieves the symptoms. Treatments tried: percocet. The treatment provided mild relief.  Emesis   This is a new problem. The current episode started yesterday. The problem has not changed since onset.There has been no fever. Pertinent negatives include no fever.   HPI Comments:  Maria Chavez is a 51 y.o. female, with a hx of Crohn's disease, who presents to the Emergency Department complaining of acute onset, intermittent episodes of nausea and vomiting onset yesterday. She also reports flank pain. Pt states she currently has a 1.2 cm kidney stone and an urethral stent in place. She has been taking percocet, last taken around 8 PM, with relief. Pt denies chest pain or any other associated symptoms at this time. No other complaints noted.   Past Medical History:  Diagnosis Date  . Crohn's disease ([place])   . Fibroids   . Kidney stones   . Kidney stones     Patient Active Problem List   Diagnosis Date Noted  . Urinary tract obstruction due to kidney stone 02/12/2017  . AKI (acute kidney injury) ([place]) 02/12/2017  . Acute left flank pain 02/12/2017  . Nausea & vomiting 02/12/2017  . Hypokalemia 02/12/2017  . Stone in kidney 02/12/2017    Past Surgical History:  Procedure Laterality Date  . CHOLECYSTECTOMY      . COLON SURGERY    . CYSTOSCOPY W/ URETERAL STENT PLACEMENT Left 02/13/2017   Procedure: CYSTOSCOPY WITH LEFT RETROGRADE PYELOGRAM/LEFT URETERAL STENT PLACEMENT;  Surgeon: Ardis Hughs, MD;  Location: [facility];  Service: Urology;  Laterality: Left;  . TUBAL LIGATION      OB History    No data available       Home Medications    Prior to Admission medications   Medication Sig Start Date End Date Taking? Authorizing Provider  nitrofurantoin, macrocrystal-monohydrate, (MACROBID) 100 MG capsule Take 1 capsule (100 mg total) by mouth 2 (two) times daily. 02/14/17  Yes Mariel Aloe, MD  oxyCODONE-acetaminophen (PERCOCET/ROXICET) 5-325 MG tablet Take 1 tablet by mouth every 4 (four) hours as needed for moderate pain or severe pain. 02/14/17  Yes Mariel Aloe, MD  tamsulosin (FLOMAX) 0.4 MG CAPS capsule Take 0.4 mg by mouth daily.    Yes Historical Provider, MD    Family History No family history on file.  Social History Social History  Substance Use Topics  . Smoking status: Current Every Day Smoker  . Smokeless tobacco: Never Used     Comment: has not smoke since she was in the hospital  . Alcohol use No     Allergies   Patient has no known allergies.   Review of Systems Review of Systems  Constitutional: Negative for fever.  Respiratory: Negative for wheezing.   Cardiovascular: Negative for chest pain.  Gastrointestinal: Positive for nausea and vomiting.  Genitourinary: Positive for flank pain.  All other systems reviewed and are negative.  Physical Exam Updated Vital Signs BP 116/83 (BP Location: Left Arm)   Pulse 95   Temp 98.4 F (36.9 C) (Oral)   Resp 20   Ht 5\' 3"  (1.6 m)   Wt 135 lb (61.2 kg)   SpO2 98%   BMI 23.91 kg/m   Physical Exam  Constitutional: She is oriented to person, place, and time. She appears well-developed and well-nourished. No distress.  HENT:  Head: Normocephalic and atraumatic.  Mouth/Throat: Oropharynx is clear and moist. No  oropharyngeal exudate.  Moist mucous membranes   Eyes: Conjunctivae and EOM are normal. Pupils are equal, round, and reactive to light.  Pinpoint pupils   Neck: Normal range of motion. Neck supple. No JVD present.  Trachea midline No bruit  Cardiovascular: Normal rate, regular rhythm, normal heart sounds and intact distal pulses.   Pulmonary/Chest: Effort normal and breath sounds normal. No stridor. No respiratory distress. She has no wheezes. She has no rales.  Abdominal: Soft. Bowel sounds are normal. She exhibits no distension and no mass. There is no rebound and no guarding.  Musculoskeletal: Normal range of motion.  Neurological: She is alert and oriented to person, place, and time. She has normal reflexes.  Skin: Skin is warm and dry. Capillary refill takes less than 2 seconds.  Psychiatric: She has a normal mood and affect. Her behavior is normal.  Nursing note and vitals reviewed.   ED Treatments / Results   Vitals:   02/18/17 2305 02/19/17 0116  BP: 116/83 123/74  Pulse: 95 92  Resp: 20 20  Temp:      DIAGNOSTIC STUDIES: Oxygen Saturation is 98% on RA, normal by my interpretation.    Results for orders placed or performed during the hospital encounter of 02/18/17  Comprehensive metabolic panel  Result Value Ref Range   Sodium 132 (L) 135 - 145 mmol/L   Potassium 2.7 (LL) 3.5 - 5.1 mmol/L   Chloride 95 (L) 101 - 111 mmol/L   CO2 29 22 - 32 mmol/L   Glucose, Bld 111 (H) 65 - 99 mg/dL   BUN 11 6 - 20 mg/dL   Creatinine, Ser 1.01 (H) 0.44 - 1.00 mg/dL   Calcium 9.2 8.9 - 10.3 mg/dL   Total Protein 6.8 6.5 - 8.1 g/dL   Albumin 2.8 (L) 3.5 - 5.0 g/dL   AST 30 15 - 41 U/L   ALT 34 14 - 54 U/L   Alkaline Phosphatase 156 (H) 38 - 126 U/L   Total Bilirubin 2.0 (H) 0.3 - 1.2 mg/dL   GFR calc non Af Amer >60 >60 mL/min   GFR calc Af Amer >60 >60 mL/min   Anion gap 8 5 - 15  CBC  Result Value Ref Range   WBC 22.3 (H) 4.0 - 10.5 K/uL   RBC 4.22 3.87 - 5.11 MIL/uL    Hemoglobin 12.2 12.0 - 15.0 g/dL   HCT 34.6 (L) 36.0 - 46.0 %   MCV 82.0 78.0 - 100.0 fL   MCH 28.9 26.0 - 34.0 pg   MCHC 35.3 30.0 - 36.0 g/dL   RDW 13.8 11.5 - 15.5 %   Platelets 246 150 - 400 K/uL  Urinalysis, Routine w reflex microscopic  Result Value Ref Range   Color, Urine YELLOW YELLOW   APPearance CLEAR CLEAR   Specific Gravity, Urine 1.005 1.005 - 1.030   pH 6.0 5.0 - 8.0   Glucose, UA  NEGATIVE NEGATIVE mg/dL   Hgb urine dipstick LARGE (A) NEGATIVE   Bilirubin Urine NEGATIVE NEGATIVE   Ketones, ur 20 (A) NEGATIVE mg/dL   Protein, ur 30 (A) NEGATIVE mg/dL   Nitrite NEGATIVE NEGATIVE   Leukocytes, UA LARGE (A) NEGATIVE   RBC / HPF 6-30 0 - 5 RBC/hpf   WBC, UA TOO NUMEROUS TO COUNT 0 - 5 WBC/hpf   Bacteria, UA RARE (A) NONE SEEN   Squamous Epithelial / LPF 0-5 (A) NONE SEEN  Lipase, blood  Result Value Ref Range   Lipase 72 (H) 11 - 51 U/L  Pregnancy, urine  Result Value Ref Range   Preg Test, Ur NEGATIVE NEGATIVE  Magnesium  Result Value Ref Range   Magnesium 1.7 1.7 - 2.4 mg/dL  Potassium  Result Value Ref Range   Potassium 2.6 (LL) 3.5 - 5.1 mmol/L  Rapid urine drug screen (hospital performed)  Result Value Ref Range   Opiates NONE DETECTED NONE DETECTED   Cocaine NONE DETECTED NONE DETECTED   Benzodiazepines NONE DETECTED NONE DETECTED   Amphetamines NONE DETECTED NONE DETECTED   Tetrahydrocannabinol POSITIVE (A) NONE DETECTED   Barbiturates NONE DETECTED NONE DETECTED   Dg Chest 2 View  Result Date: 02/19/2017 CLINICAL DATA:  Decreased O2 saturation.  Initial encounter. EXAM: CHEST  2 VIEW COMPARISON:  None. FINDINGS: The lungs are well-aerated. Patchy basilar airspace opacification is suggested on the lateral view, raising concern for pneumonia. There is no evidence of pleural effusion or pneumothorax. The heart is borderline enlarged. No acute osseous abnormalities are seen. IMPRESSION: Patchy basilar airspace opacification suggested on the lateral view,  raising concern for pneumonia. Borderline cardiomegaly. Electronically Signed   By: Garald Balding M.D.   On: 02/19/2017 02:49   Ct Renal Stone Study  Result Date: 02/19/2017 CLINICAL DATA:  Left flank pain and hematuria. EXAM: CT ABDOMEN AND PELVIS WITHOUT CONTRAST TECHNIQUE: Multidetector CT imaging of the abdomen and pelvis was performed following the standard protocol without IV contrast. COMPARISON:  CT 8 days prior 02/12/2017 FINDINGS: Lower chest: Small bilateral pleural effusions. Associated bibasilar opacities, only partially included. Hepatobiliary: No evidence of focal lesion allowing for lack contrast. The liver appears prominent size. Clips in the gallbladder fossa postcholecystectomy. No biliary dilatation. Pancreas: No ductal dilatation or inflammation. Spleen: Upper normal in size spanning 12.8 cm. Adrenals/Urinary Tract: Left ureteral stent in place, proximal pigtail in the renal pelvis, distal pigtail in the bladder. There is a 9 mm stone fragment adjacent to the proximal loop in the renal pelvis. 4 mm stone in the left lower left kidney. Left kidney remains enlarged and edematous with perinephric edema. There is no residual renal collecting system dilatation. Right kidney is normal in size without urolithiasis. Urinary bladder is minimally distended without wall thickening. Stomach/Bowel: Small hiatal hernia. Enteric sutures in the ascending colon likely post ileocecectomy. Enteric sutures in the sigmoid colon. No evidence of bowel inflammation or obstruction. There is stool throughout the colon. Vascular/Lymphatic: Aortic atherosclerosis without aneurysm. Small retroperitoneal nodes on the left. Reproductive: Uterus is mildly bulbous in appearance. No evidence of adnexal mass. Other: No free air, free fluid, or intra-abdominal fluid collection. Musculoskeletal: There are no acute or suspicious osseous abnormalities. IMPRESSION: 1. Placement of a left ureteral stent with resolved  hydronephrosis. A 21mm stone fragment persists in the renal pelvis with smaller fragment in the lower left kidney. Persistent edematous appearance of the left kidney suspicious for concurrent pyelonephritis. 2. Bilateral pleural effusions and bibasilar opacities at the lung  bases are only partially included. Electronically Signed   By: Jeb Levering M.D.   On: 02/19/2017 03:14   Ct Renal Stone Study  Result Date: 02/12/2017 CLINICAL DATA:  Initial evaluation for acute left flank pain for 4 days. Per history, recent CT scan with IV contrast. EXAM: CT ABDOMEN AND PELVIS WITHOUT CONTRAST TECHNIQUE: Multidetector CT imaging of the abdomen and pelvis was performed following the standard protocol without IV contrast. COMPARISON:  None available. FINDINGS: Lower chest: Small layering left pleural effusion with associated atelectasis. Scattered atelectatic changes with trace effusion at the right lung base is well. Hepatobiliary: The liver demonstrates a normal unenhanced appearance. Gallbladder surgically absent. Prominence of the common bile duct like related to post cholecystectomy changes. Pancreas: Pancreas within normal limits. Spleen: Spleen within normal limits. Adrenals/Urinary Tract: Adrenal glands are normal. Left kidney is asymmetrically enlarged as compared to the right. Parenchymal contrast staining present within left kidney with secrete IV contrast material within the proximal left renal collecting system. There is an obstructive stone measuring approximately 12 mm at the left UPJ (series 5, image 36). No IV contrast material seen distally within the left ureter. Associated perinephric and periureteral fat stranding. Moderate to severe left hydronephrosis. No other definite stones within left kidney, although evaluation limited by presence of IV contrast. Small amount secrete IV contrast material present within the right renal collecting system. No definite right-sided renal or ureteral calculi. No  right-sided hydronephrosis or hydroureter. Bladder partially distended with secreted contrast material within the bladder lumen. Stomach/Bowel: Stomach within normal limits. No evidence for bowel obstruction. Appendix appears to be surgically absent. Additional anastomotic suture present about the sigmoid colon. No acute inflammatory changes about the bowels. Scattered free fluid approximates the descending colon related to the obstructive process within the left kidney. Vascular/Lymphatic: Mild aortic atherosclerosis. No aneurysm. No adenopathy. Reproductive: Uterus and ovaries within normal limits. Other: No free air. Small amount of free fluid related to the obstructive process within the left kidney. Tiny fat containing paraumbilical hernia noted. Musculoskeletal: No acute osseus abnormality. No worrisome lytic or blastic osseous lesions. Central disc protrusion noted at L5-S1. IMPRESSION: 1. 12 mm obstructive left UPJ stone with secondary moderate to severe left hydronephrosis. Retained IV contrast material on board from recent CT. 2. Small layering left pleural effusion with associated atelectasis. 3. Central disc protrusion at L5-S1. Electronically Signed   By: Jeannine Boga M.D.   On: 02/12/2017 21:43    COORDINATION OF CARE:  1:40 AM Discussed treatment plan with pt at bedside and pt agreed to plan.  Labs (all labs ordered are listed, but only abnormal results are displayed) Labs Reviewed  COMPREHENSIVE METABOLIC PANEL - Abnormal; Notable for the following:       Result Value   Sodium 132 (*)    Potassium 2.7 (*)    Chloride 95 (*)    Glucose, Bld 111 (*)    Creatinine, Ser 1.01 (*)    Albumin 2.8 (*)    Alkaline Phosphatase 156 (*)    Total Bilirubin 2.0 (*)    All other components within normal limits  CBC - Abnormal; Notable for the following:    WBC 22.3 (*)    HCT 34.6 (*)    All other components within normal limits  URINALYSIS, ROUTINE W REFLEX MICROSCOPIC - Abnormal;  Notable for the following:    Hgb urine dipstick LARGE (*)    Ketones, ur 20 (*)    Protein, ur 30 (*)    Leukocytes,  UA LARGE (*)    Bacteria, UA RARE (*)    Squamous Epithelial / LPF 0-5 (*)    All other components within normal limits  LIPASE, BLOOD - Abnormal; Notable for the following:    Lipase 72 (*)    All other components within normal limits    Radiology No results found.  Procedures Procedures (including critical care time)  Medications Ordered in ED  Medications  dextrose 5 % and 0.9 % NaCl with KCl 40 mEq/L infusion ( Intravenous New Bag/Given 02/19/17 0414)  ondansetron (ZOFRAN-ODT) disintegrating tablet 4 mg (4 mg Oral Given 02/18/17 2050)  cefTRIAXone (ROCEPHIN) 1 g in dextrose 5 % 50 mL IVPB (0 g Intravenous Stopped 02/19/17 0314)  sodium chloride 0.9 % bolus 1,000 mL (0 mLs Intravenous Stopped 02/19/17 0324)  sodium chloride 0.9 % bolus 1,000 mL (0 mLs Intravenous Stopped 02/19/17 0324)  ketorolac (TORADOL) 30 MG/ML injection 30 mg (30 mg Intravenous Given 02/19/17 0217)  ondansetron (ZOFRAN) injection 4 mg (4 mg Intravenous Given 02/19/17 0215)  magnesium sulfate IVPB 2 g 50 mL (2 g Intravenous New Bag/Given 02/19/17 0417)  potassium chloride SA (K-DUR,KLOR-CON) CR tablet 40 mEq (40 mEq Oral Given 02/19/17 0418)    EKG Interpretation  Date/Time:  Friday February 19 2017 04:11:10 EST Ventricular Rate:  86 PR Interval:    QRS Duration: 158 QT Interval:  410 QTC Calculation: 491 R Axis:   37 Text Interpretation:  Sinus rhythm Left bundle branch block Confirmed by [facility]  MD, Emmaline Kluver ([id number]) on 02/19/2017 4:45:52 AM       Case d/w Dr. Alinda Money of urology, stent in good postion admit for IVF and antibiotics urology to consult.     This is a 51 y.o. -year-old female presents with pyelonephritis and recurrent hypokalemia.  The patient is nontoxic-appearing on exam and vital signs are within normal limits.    MDM Reviewed: previous chart, nursing note and vitals Reviewed  previous: labs and ECG Interpretation: labs, ECG, x-ray and CT scan (CT: persistent left stone in the ureter by me, no CHG on CXR hypokalemia confirmed on 2 checks.  ) Total time providing critical care: 75-105 minutes. This excludes time spent performing separately reportable procedures and services. Consults: admitting MD and urology  CRITICAL CARE Performed by: Carlisle Beers Total critical care time:90  minutes Critical care time was exclusive of separately billable procedures and treating other patients. Critical care was necessary to treat or prevent imminent or life-threatening deterioration. Critical care was time spent personally by me on the following activities: development of treatment plan with patient and/or surrogate as well as nursing, discussions with consultants, evaluation of patient's response to treatment, examination of patient, obtaining history from patient or surrogate, ordering and performing treatments and interventions, ordering and review of laboratory studies, ordering and review of radiographic studies, pulse oximetry and re-evaluation of patient's condition.  The patient appears reasonably stabilized for admission considering the current resources, flow, and capabilities available in the ED at this time, and I doubt any other [facility] requiring further screening and/or treatment in the ED prior to admission. I personally performed the services described in this documentation, which was scribed in my presence. The recorded information has been reviewed and is accurate.       Veatrice Kells, MD 02/19/17 [phone]

## 2017-02-19 NOTE — ED Notes (Signed)
K+  2.6 

## 2017-02-19 NOTE — ED Notes (Signed)
EKG given to EDP,Palumbo,MD., for review. 

## 2017-02-19 NOTE — ED Notes (Signed)
Gave report to KT, 4W

## 2017-02-19 NOTE — ED Notes (Signed)
Waiting on [facility] to see patient

## 2017-02-19 NOTE — ED Notes (Signed)
Ordered a diet tray

## 2017-02-20 DIAGNOSIS — N10 Acute tubulo-interstitial nephritis: Secondary | ICD-10-CM

## 2017-02-20 LAB — RENAL FUNCTION PANEL
ANION GAP: 6 (ref 5–15)
Albumin: 1.9 g/dL — ABNORMAL LOW (ref 3.5–5.0)
BUN: 5 mg/dL — ABNORMAL LOW (ref 6–20)
CALCIUM: 8.1 mg/dL — AB (ref 8.9–10.3)
CHLORIDE: 105 mmol/L (ref 101–111)
CO2: 22 mmol/L (ref 22–32)
CREATININE: 0.83 mg/dL (ref 0.44–1.00)
GFR calc non Af Amer: >60 mL/min (ref >60)
GLUCOSE: 118 mg/dL — AB (ref 65–99)
Phosphorus: 1 mg/dL — CL (ref 2.5–4.6)
Potassium: 4.2 mmol/L (ref 3.5–5.1)
SODIUM: 133 mmol/L — AB (ref 135–145)

## 2017-02-20 LAB — CBC
HCT: 27.6 % — ABNORMAL LOW (ref 36.0–46.0)
Hemoglobin: 9.5 g/dL — ABNORMAL LOW (ref 12.0–15.0)
MCH: 28.6 pg (ref 26.0–34.0)
MCHC: 34.4 g/dL (ref 30.0–36.0)
MCV: 83.1 fL (ref 78.0–100.0)
PLATELETS: 313 10*3/uL (ref 150–400)
RBC: 3.32 MIL/uL — ABNORMAL LOW (ref 3.87–5.11)
RDW: 14.2 % (ref 11.5–15.5)
WBC: 17.2 10*3/uL — AB (ref 4.0–10.5)

## 2017-02-20 LAB — COMPREHENSIVE METABOLIC PANEL
ALT: 28 U/L (ref 14–54)
AST: 28 U/L (ref 15–41)
Albumin: 2 g/dL — ABNORMAL LOW (ref 3.5–5.0)
Alkaline Phosphatase: 110 U/L (ref 38–126)
Anion gap: 4 — ABNORMAL LOW (ref 5–15)
BILIRUBIN TOTAL: 1.5 mg/dL — AB (ref 0.3–1.2)
BUN: 6 mg/dL (ref 6–20)
CO2: 23 mmol/L (ref 22–32)
CREATININE: 0.84 mg/dL (ref 0.44–1.00)
Calcium: 8.3 mg/dL — ABNORMAL LOW (ref 8.9–10.3)
Chloride: 108 mmol/L (ref 101–111)
Glucose, Bld: 129 mg/dL — ABNORMAL HIGH (ref 65–99)
POTASSIUM: 4.2 mmol/L (ref 3.5–5.1)
Sodium: 135 mmol/L (ref 135–145)
TOTAL PROTEIN: 5.6 g/dL — AB (ref 6.5–8.1)

## 2017-02-20 LAB — URINE CULTURE: Culture: <10000 — AB

## 2017-02-20 MED ORDER — POTASSIUM CHLORIDE CRYS ER 20 MEQ PO TBCR
40.0000 meq | EXTENDED_RELEASE_TABLET | Freq: Every day | ORAL | Status: DC
  Administered 2017-02-20 – 2017-02-21 (×2): 40 meq via ORAL
  Filled 2017-02-20 (×2): qty 2

## 2017-02-20 MED ORDER — MORPHINE SULFATE (PF) 4 MG/ML IV SOLN
1.0000 mg | INTRAVENOUS | Status: DC | PRN
  Administered 2017-02-20: 1 mg via INTRAVENOUS
  Filled 2017-02-20: qty 1

## 2017-02-20 MED ORDER — OXYCODONE HCL 5 MG PO TABS
5.0000 mg | ORAL_TABLET | ORAL | Status: DC | PRN
  Administered 2017-02-20 – 2017-02-21 (×4): 10 mg via ORAL
  Administered 2017-02-21: 5 mg via ORAL
  Administered 2017-02-21 – 2017-02-23 (×10): 10 mg via ORAL
  Filled 2017-02-20 (×15): qty 2

## 2017-02-20 MED ORDER — K PHOS MONO-SOD PHOS DI & MONO 155-852-130 MG PO TABS
500.0000 mg | ORAL_TABLET | Freq: Two times a day (BID) | ORAL | Status: DC
  Administered 2017-02-20 – 2017-02-21 (×3): 500 mg via ORAL
  Filled 2017-02-20 (×5): qty 2

## 2017-02-20 NOTE — Progress Notes (Signed)
CRITICAL VALUE ALERT  Critical value received:  Phosphorus  Date of notification:  02/20/17  Time of notification:  1100  Critical value read back: Yes  Nurse who received alert:  Dorothyann Gibbs  MD notified (1st page):  Nada Libman  Time of first page:  [phone]  MD notified (2nd page):  Time of second page:  Responding MD: Nada Libman  Time MD responded: [phone]

## 2017-02-20 NOTE — Progress Notes (Signed)
Maria Chavez QKM:[id number] DOB: [birth year] DOA: 02/18/2017 PCP: No PCP Per Patient  Brief narrative:  50 ? Known h/o crohn's s/p colectomy [facility]  fbroids H/o lithotripsy -seen in [place] in the past Dr. Lora Havens had Lithotripsy in the past as well  Admit 2/23-2/28 with nephrolothiasis-high grade L Ureteric obs with 12 mm stone--had 24 x 6 Fr double J stent placed and d/c with Nitrofurantoin--this was to Rx Klebsiella Pyelo Return to ED fever n/v severe abd pain and weakness 3.2  Past medical history-As per Problem list Chart reviewed as below-   Consultants:  Urology  Procedures:  none  Antibiotics:  Ceftriaxone 3/3   Subjective  Doing fair. Somewhat improved from prior. He is tolerating clears and her regular diet no nausea or vomiting unclear Abdominal pain is moderate Still has some flank pressure No chest pain No lower extremity swelling No sputum or chills   Objective    Interim History:   Telemetry: nad   Objective: Vitals:   02/19/17 1119 02/19/17 1800 02/19/17 2150 02/20/17 0439  BP: 120/83 120/65 104/62 113/72  Pulse: 85 91 74 67  Resp: 20 (!) '24 20 18  ' Temp: 98.8 F (37.1 C) (!) 102.7 F (39.3 C) 99.6 F (37.6 C) 98.7 F (37.1 C)  TempSrc: Oral Oral Oral Oral  SpO2: 98% 94% 95% 96%  Weight: 61.5 kg (135 lb 9.3 oz)     Height: '5\' 3"'  (1.6 m)       Intake/Output Summary (Last 24 hours) at 02/20/17 1314 Last data filed at 02/20/17 1009  Gross per 24 hour  Intake          3630.83 ml  Output             5500 ml  Net         -1869.17 ml    Exam: Alert oriented nontoxic appearing and in neck soft supple chest clinically clear S1-S2 no murmur rub or gallop abdomen soft Abdominal incision from umbilicus to pubis, Foley in situ, lower extremity soft nontender moving all 4 limbs equally smile symmetric no rash  Data Reviewed: Basic Metabolic Panel:  Recent Labs Lab 02/18/17 2307 02/18/17 2312 02/19/17 0140 02/19/17 2207  02/20/17 0512  NA 132*  --   --   --  133*  135  K 2.7*  --  2.6* 3.5 4.2  4.2  CL 95*  --   --   --  105  108  CO2 29  --   --   --  22  23  GLUCOSE 111*  --   --   --  118*  129*  BUN 11  --   --   --  5*  6  CREATININE 1.01*  --   --   --  0.83  0.84  CALCIUM 9.2  --   --   --  8.1*  8.3*  MG  --  1.7 1.7 2.2  --   PHOS  --   --   --   --  1.0*   Liver Function Tests:  Recent Labs Lab 02/18/17 2307 02/20/17 0512  AST 30 28  ALT 34 28  ALKPHOS 156* 110  BILITOT 2.0* 1.5*  PROT 6.8 5.6*  ALBUMIN 2.8* 1.9*  2.0*    Recent Labs Lab 02/18/17 2307  LIPASE 72*   No results for input(s): AMMONIA in the last 168 hours. CBC:  Recent Labs Lab 02/18/17 2307 02/20/17 0512  WBC 22.3* 17.2*  HGB 12.2 9.5*  HCT 34.6* 27.6*  MCV 82.0 83.1  PLT 246 313   Cardiac Enzymes:  Recent Labs Lab 02/13/17 1550 02/13/17 2125  TROPONINI 0.03* 0.03*   BNP: Invalid input(s): POCBNP CBG:  Recent Labs Lab 02/19/17 2158  GLUCAP 139*    Recent Results (from the past 240 hour(s))  Urine culture     Status: None   Collection Time: 02/12/17  8:18 PM  Result Value Ref Range Status   Specimen Description URINE, CLEAN CATCH  Final   Special Requests NONE  Final   Culture   Final    NO GROWTH Performed at [facility], [place] [address] [place], [place] [address]    Report Status 02/14/2017 FINAL  Final  Surgical pcr screen     Status: None   Collection Time: 02/13/17  4:33 AM  Result Value Ref Range Status   MRSA, PCR NEGATIVE NEGATIVE Final   Staphylococcus aureus NEGATIVE NEGATIVE Final    Comment:        The Xpert SA Assay (FDA approved for NASAL specimens in patients over 48 years of age), is one component of a comprehensive surveillance program.  Test performance has been validated by [facility] for patients greater than or equal to 33 year old. It is not intended to diagnose infection nor to guide or monitor treatment.   Urine culture      Status: Abnormal   Collection Time: 02/13/17  8:59 AM  Result Value Ref Range Status   Specimen Description CYSTOSCOPY  Final   Special Requests NONE  Final   Culture 60,000 COLONIES/mL KLEBSIELLA PNEUMONIAE (A)  Final   Report Status 02/16/2017 FINAL  Final   Organism ID, Bacteria KLEBSIELLA PNEUMONIAE (A)  Final      Susceptibility   Klebsiella pneumoniae - MIC*    AMPICILLIN >=32 RESISTANT Resistant     CEFAZOLIN <=4 SENSITIVE Sensitive     CEFEPIME <=1 SENSITIVE Sensitive     CEFTAZIDIME <=1 SENSITIVE Sensitive     CEFTRIAXONE <=1 SENSITIVE Sensitive     CIPROFLOXACIN <=0.25 SENSITIVE Sensitive     GENTAMICIN <=1 SENSITIVE Sensitive     IMIPENEM <=0.25 SENSITIVE Sensitive     TRIMETH/SULFA <=20 SENSITIVE Sensitive     AMPICILLIN/SULBACTAM 16 INTERMEDIATE Intermediate     PIP/TAZO <=4 SENSITIVE Sensitive     Extended ESBL NEGATIVE Sensitive     * 60,000 COLONIES/mL KLEBSIELLA PNEUMONIAE  Culture, blood (routine x 2)     Status: None   Collection Time: 02/13/17  9:43 AM  Result Value Ref Range Status   Specimen Description BLOOD LEFT HAND  Final   Special Requests BOTTLES DRAWN AEROBIC AND ANAEROBIC 5 CC EACH  Final   Culture   Final    NO GROWTH 5 DAYS Performed at [facility], [address], [place], [place] [address]    Report Status 02/18/2017 FINAL  Final  Culture, blood (routine x 2)     Status: None   Collection Time: 02/13/17  9:43 AM  Result Value Ref Range Status   Specimen Description BLOOD RIGHT HAND  Final   Special Requests BOTTLES DRAWN AEROBIC AND ANAEROBIC 5CC EACH  Final   Culture   Final    NO GROWTH 5 DAYS Performed at [facility], [place] [address], [place], [place] [address]    Report Status 02/18/2017 FINAL  Final  Urine culture     Status: Abnormal   Collection Time: 02/18/17 10:17 PM  Result Value Ref  Range Status   Specimen Description URINE, CLEAN CATCH  Final   Special Requests NONE  Final   Culture (A)  Final    <10,000  COLONIES/mL INSIGNIFICANT GROWTH Performed at [facility], [address], [place], [place] [address]    Report Status 02/20/2017 FINAL  Final  Culture, blood (routine x 2)     Status: None (Preliminary result)   Collection Time: 02/19/17 10:40 AM  Result Value Ref Range Status   Specimen Description BLOOD LEFT ARM  Final   Special Requests BOTTLES DRAWN AEROBIC AND ANAEROBIC 3CC  Final   Culture   Final    NO GROWTH <12 HOURS Performed at [facility], [place] [address], [place], [place] [address]    Report Status PENDING  Incomplete  Culture, blood (routine x 2)     Status: None (Preliminary result)   Collection Time: 02/19/17 10:55 AM  Result Value Ref Range Status   Specimen Description BLOOD RIGHT ARM  Final   Special Requests BOTTLES DRAWN AEROBIC AND ANAEROBIC 3CC  Final   Culture   Final    NO GROWTH <12 HOURS Performed at [facility], [place] [address], [place], [place] [address]    Report Status PENDING  Incomplete     Studies:              All Imaging reviewed and is as per above notation   Scheduled Meds: . cefTRIAXone (ROCEPHIN) IVPB 1 gram/50 mL D5W  1 g Intravenous Q24H  . enoxaparin (LOVENOX) injection  40 mg Subcutaneous Q24H  . phosphorus  500 mg Oral BID  . potassium chloride  40 mEq Oral Daily  . tamsulosin  0.4 mg Oral Daily   Continuous Infusions: . dextrose 5 % and 0.9 % NaCl with KCl 40 mEq/L 50 mL/hr at 02/20/17 0925     Assessment/Plan:  1. Sepsis secondary to pyelonephritis-continue ceftriaxone. Urine culture from 3/1 shows less than 10,000 colony-forming units. Continue Rocephin for now--will need to D escalate cautiously to medications that we will treat and will treat for at least one week. Continue IV fluids for now and if tolerating diet and will allow regular diet only 2. Hypokalemia-on admission potassium 2.6 replaced initially with IV dextrose with 40 of K replacing now with lower rate at 50 cc per hour, started K dur 40  daily as well 3. hypophosphatemia replacing phosphorus with K-Phos 4. Elevated alk phs/bilirubin 2-unclear etiology-? Sepsis-resolving-repeat complete metabolic a.m.   Full code Keep inpatient for now. Condition discharge in next 1-2 days   Verneita Griffes, MD  [facility] Pager [phone] 02/20/2017, 1:14 PM    LOS: 1 day

## 2017-02-21 LAB — CBC WITH DIFFERENTIAL/PLATELET
Basophils Absolute: 0 10*3/uL (ref 0.0–0.1)
Basophils Relative: 0 %
EOS ABS: 0 10*3/uL (ref 0.0–0.7)
Eosinophils Relative: 0 %
HCT: 30.3 % — ABNORMAL LOW (ref 36.0–46.0)
Hemoglobin: 10.4 g/dL — ABNORMAL LOW (ref 12.0–15.0)
LYMPHS PCT: 5 %
Lymphs Abs: 0.9 10*3/uL (ref 0.7–4.0)
MCH: 28.8 pg (ref 26.0–34.0)
MCHC: 34.3 g/dL (ref 30.0–36.0)
MCV: 83.9 fL (ref 78.0–100.0)
MONO ABS: 0.9 10*3/uL (ref 0.1–1.0)
Monocytes Relative: 5 %
NEUTROS PCT: 90 %
Neutro Abs: 15.5 10*3/uL — ABNORMAL HIGH (ref 1.7–7.7)
PLATELETS: 358 10*3/uL (ref 150–400)
RBC: 3.61 MIL/uL — ABNORMAL LOW (ref 3.87–5.11)
RDW: 14.4 % (ref 11.5–15.5)
WBC: 17.3 10*3/uL — AB (ref 4.0–10.5)

## 2017-02-21 LAB — COMPREHENSIVE METABOLIC PANEL
ALBUMIN: 2.3 g/dL — AB (ref 3.5–5.0)
ALT: 50 U/L (ref 14–54)
AST: 55 U/L — ABNORMAL HIGH (ref 15–41)
Alkaline Phosphatase: 126 U/L (ref 38–126)
Anion gap: 8 (ref 5–15)
BUN: 5 mg/dL — AB (ref 6–20)
CHLORIDE: 98 mmol/L — AB (ref 101–111)
CO2: 24 mmol/L (ref 22–32)
CREATININE: 0.8 mg/dL (ref 0.44–1.00)
Calcium: 8.6 mg/dL — ABNORMAL LOW (ref 8.9–10.3)
GFR calc Af Amer: >60 mL/min (ref >60)
GFR calc non Af Amer: >60 mL/min (ref >60)
GLUCOSE: 104 mg/dL — AB (ref 65–99)
Potassium: 4 mmol/L (ref 3.5–5.1)
SODIUM: 130 mmol/L — AB (ref 135–145)
Total Bilirubin: 1.3 mg/dL — ABNORMAL HIGH (ref 0.3–1.2)
Total Protein: 6 g/dL — ABNORMAL LOW (ref 6.5–8.1)

## 2017-02-21 LAB — PHOSPHORUS: Phosphorus: 2.8 mg/dL (ref 2.5–4.6)

## 2017-02-21 MED ORDER — ZOLPIDEM TARTRATE 5 MG PO TABS
5.0000 mg | ORAL_TABLET | Freq: Every day | ORAL | Status: DC
  Administered 2017-02-21 – 2017-02-22 (×2): 5 mg via ORAL
  Filled 2017-02-21 (×2): qty 1

## 2017-02-21 NOTE — Progress Notes (Signed)
Maria Chavez JIR:[id number] DOB: 08/08/1966 DOA: 02/18/2017 PCP: No PCP Per Patient  Brief narrative:  51 ? Known h/o crohn's s/p colectomy [facility]  fbroids H/o lithotripsy -seen in [place] in the past Dr. Lora Havens had Lithotripsy in the past as well  Admit 2/23-2/28 with nephrolothiasis-high grade L Ureteric obs with 12 mm stone--had 24 x 6 Fr double J stent placed and d/c with Nitrofurantoin--this was to Rx Klebsiella Pyelo Return to ED fever n/v severe abd pain and weakness 3.2 Urology consulted   Past medical history-As per Problem list Chart reviewed as below-   Consultants:  Urology  Procedures:  none  Antibiotics:  Ceftriaxone 3/3   Subjective   Fair  No fever overnight Still some abd pain.  No cp No chills ate half a sandwiich Dejected about WBC still ?      Objective    Interim History:   Telemetry: nad   Objective: Vitals:   02/20/17 0439 02/20/17 1500 02/20/17 2206 02/21/17 0700  BP: 113/72 110/70 126/72 125/79  Pulse: 67 72 95 85  Resp: _0 Temp: 98.7 F (37.1 C) 98.9 F (37.2 C) 98.8 F (37.1 C) 99.4 F (37.4 C)  TempSrc: Oral Oral Oral Oral  SpO2: 96% 96% 99% 97%  Weight:      Height:        Intake/Output Summary (Last 24 hours) at 02/21/17 1008 Last data filed at 02/21/17 0700  Gross per 24 hour  Intake          1119.17 ml  Output             6600 ml  Net         -5480.83 ml    Exam: Alert oriented nontoxic appearing and in neck soft supple chest clinically clear S1-S2 no murmur rub or gallop abdomen soft Abdominal incision from umbilicus to pubis, Foley in situ, lower extremity soft nontender moving all 4 limbs equally smile symmetric no rash  Data Reviewed: Basic Metabolic Panel:  Recent Labs Lab 02/18/17 2307 02/18/17 2312 02/19/17 0140 02/19/17 2207 02/20/17 0512 02/21/17 0516  NA 132*  --   --   --  133*  135 130*  K 2.7*  --  2.6* 3.5 4.2  4.2 4.0  CL 95*  --   --   --  105  108 98*  CO2 29   --   --   --  _1 GLUCOSE 111*  --   --   --  118*  129* 104*  BUN 11  --   --   --  5*  6 5*  CREATININE 1.01*  --   --   --  0.83  0.84 0.80  CALCIUM 9.2  --   --   --  8.1*  8.3* 8.6*  MG  --  1.7 1.7 2.2  --   --   PHOS  --   --   --   --  1.0* 2.8   Liver Function Tests:  Recent Labs Lab 02/18/17 2307 02/20/17 0512 02/21/17 0516  AST 30 28 55*  ALT 34 28 50  ALKPHOS 156* 110 126  BILITOT 2.0* 1.5* 1.3*  PROT 6.8 5.6* 6.0*  ALBUMIN 2.8* 1.9*  2.0* 2.3*    Recent Labs Lab 02/18/17 2307  LIPASE 72*   No results for input(s): AMMONIA in the last 168 hours. CBC:  Recent Labs Lab 02/18/17 2307 02/20/17 0512 02/21/17 0516  WBC 22.3*  17.2* 17.3*  NEUTROABS  --   --  15.5*  HGB 12.2 9.5* 10.4*  HCT 34.6* 27.6* 30.3*  MCV 82.0 83.1 83.9  PLT 246 313 358   Cardiac Enzymes: No results for input(s): CKTOTAL, CKMB, CKMBINDEX, TROPONINI in the last 168 hours. BNP: Invalid input(s): POCBNP CBG:  Recent Labs Lab 02/19/17 2158  GLUCAP 139*    Recent Results (from the past 240 hour(s))  Urine culture     Status: None   Collection Time: 02/12/17  8:18 PM  Result Value Ref Range Status   Specimen Description URINE, CLEAN CATCH  Final   Special Requests NONE  Final   Culture   Final    NO GROWTH Performed at [facility], [place] [address], [place], [place] [address]    Report Status 02/14/2017 FINAL  Final  Surgical pcr screen     Status: None   Collection Time: 02/13/17  4:33 AM  Result Value Ref Range Status   MRSA, PCR NEGATIVE NEGATIVE Final   Staphylococcus aureus NEGATIVE NEGATIVE Final    Comment:        The Xpert SA Assay (FDA approved for NASAL specimens in patients over 22 years of age), is one component of a comprehensive surveillance program.  Test performance has been validated by [facility] for patients greater than or equal to 89 year old. It is not intended to diagnose infection nor to guide or monitor treatment.     Urine culture     Status: Abnormal   Collection Time: 02/13/17  8:59 AM  Result Value Ref Range Status   Specimen Description CYSTOSCOPY  Final   Special Requests NONE  Final   Culture 60,000 COLONIES/mL KLEBSIELLA PNEUMONIAE (A)  Final   Report Status 02/16/2017 FINAL  Final   Organism ID, Bacteria KLEBSIELLA PNEUMONIAE (A)  Final      Susceptibility   Klebsiella pneumoniae - MIC*    AMPICILLIN >=32 RESISTANT Resistant     CEFAZOLIN <=4 SENSITIVE Sensitive     CEFEPIME <=1 SENSITIVE Sensitive     CEFTAZIDIME <=1 SENSITIVE Sensitive     CEFTRIAXONE <=1 SENSITIVE Sensitive     CIPROFLOXACIN <=0.25 SENSITIVE Sensitive     GENTAMICIN <=1 SENSITIVE Sensitive     IMIPENEM <=0.25 SENSITIVE Sensitive     TRIMETH/SULFA <=20 SENSITIVE Sensitive     AMPICILLIN/SULBACTAM 16 INTERMEDIATE Intermediate     PIP/TAZO <=4 SENSITIVE Sensitive     Extended ESBL NEGATIVE Sensitive     * 60,000 COLONIES/mL KLEBSIELLA PNEUMONIAE  Culture, blood (routine x 2)     Status: None   Collection Time: 02/13/17  9:43 AM  Result Value Ref Range Status   Specimen Description BLOOD LEFT HAND  Final   Special Requests BOTTLES DRAWN AEROBIC AND ANAEROBIC 5 CC EACH  Final   Culture   Final    NO GROWTH 5 DAYS Performed at [facility], [address], [place], [place] [address]    Report Status 02/18/2017 FINAL  Final  Culture, blood (routine x 2)     Status: None   Collection Time: 02/13/17  9:43 AM  Result Value Ref Range Status   Specimen Description BLOOD RIGHT HAND  Final   Special Requests BOTTLES DRAWN AEROBIC AND ANAEROBIC 5CC EACH  Final   Culture   Final    NO GROWTH 5 DAYS Performed at [facility], [place] [address], [place], [place] [address]    Report Status 02/18/2017 FINAL  Final  Urine culture     Status: Abnormal   Collection Time: 02/18/17 10:17 PM  Result Value Ref Range Status   Specimen Description URINE, CLEAN CATCH  Final   Special Requests NONE  Final   Culture (A)   Final    <10,000 COLONIES/mL INSIGNIFICANT GROWTH Performed at [facility], [address], [place], [place] [address]    Report Status 02/20/2017 FINAL  Final  Culture, blood (routine x 2)     Status: None (Preliminary result)   Collection Time: 02/19/17 10:40 AM  Result Value Ref Range Status   Specimen Description BLOOD LEFT ARM  Final   Special Requests BOTTLES DRAWN AEROBIC AND ANAEROBIC 3CC  Final   Culture   Final    NO GROWTH 1 DAY Performed at [facility], [place] [address], [place], [place] [address]    Report Status PENDING  Incomplete  Culture, blood (routine x 2)     Status: None (Preliminary result)   Collection Time: 02/19/17 10:55 AM  Result Value Ref Range Status   Specimen Description BLOOD RIGHT ARM  Final   Special Requests BOTTLES DRAWN AEROBIC AND ANAEROBIC 3CC  Final   Culture   Final    NO GROWTH 1 DAY Performed at [facility], [place] [address] [place], [place] [address]    Report Status PENDING  Incomplete     Studies:              All Imaging reviewed and is as per above notation   Scheduled Meds: . cefTRIAXone (ROCEPHIN) IVPB 1 gram/50 mL D5W  1 g Intravenous Q24H  . enoxaparin (LOVENOX) injection  40 mg Subcutaneous Q24H  . phosphorus  500 mg Oral BID  . potassium chloride  40 mEq Oral Daily  . tamsulosin  0.4 mg Oral Daily   Continuous Infusions: . dextrose 5 % and 0.9 % NaCl with KCl 40 mEq/L 50 mL/hr at 02/20/17 2157     Assessment/Plan:  1. Sepsis secondary to pyelonephritis-continue ceftriaxone. Urine culture from 3/1 shows less than 10,000 colony-forming units. Continue Rocephin for now--will need to D escalate cautiously to medications that we will treat and will treat for at least one week. Continue IV fluids for now and if tolerating diet and will allow regular diet only--might need to involve Urology regarding prior stent placement and retained 12 mm stone if still having WBc and pain/fever 2. abd pain-unclear if  corhns flare.  No diarrhea and tol diet 3. Hypokalemia-on admission potassium 2.6 replaced initially with IV dextrose with 40 of K. replacing now with l K dur 40 daily as well-labs recheck in am 4. hypophosphatemia replacing phosphorus with K-Phos 5. Elevated alk phs/bilirubin 2-unclear etiology-? Sepsis-resolving-bili down and ASt donw? ETOH.   Full code Keep inpatient for now. Dispo unclear-awaity resolution infection--might need Urology input if no better   Verneita Griffes, MD  [facility] Pager [phone] 02/21/2017, 10:08 AM    LOS: 2 days

## 2017-02-21 NOTE — Progress Notes (Signed)
1 drop Peppermint EO mixed into 30 mL unscented lotion and massaged into patient's lower abdomen and R flank per pt request for abdominal pain.

## 2017-02-21 NOTE — Progress Notes (Signed)
Patient states Peppermint lotion helped relieve her abdominal pain. Pain 4/10 at this time. Will continue to monitor closely.

## 2017-02-22 LAB — CBC
HEMATOCRIT: 32.2 % — AB (ref 36.0–46.0)
HEMOGLOBIN: 10.9 g/dL — AB (ref 12.0–15.0)
MCH: 28.4 pg (ref 26.0–34.0)
MCHC: 33.9 g/dL (ref 30.0–36.0)
MCV: 83.9 fL (ref 78.0–100.0)
Platelets: 410 10*3/uL — ABNORMAL HIGH (ref 150–400)
RBC: 3.84 MIL/uL — AB (ref 3.87–5.11)
RDW: 14.5 % (ref 11.5–15.5)
WBC: 16.2 10*3/uL — ABNORMAL HIGH (ref 4.0–10.5)

## 2017-02-22 LAB — BASIC METABOLIC PANEL
ANION GAP: 11 (ref 5–15)
BUN: 7 mg/dL (ref 6–20)
CALCIUM: 9.1 mg/dL (ref 8.9–10.3)
CO2: 22 mmol/L (ref 22–32)
CREATININE: 0.81 mg/dL (ref 0.44–1.00)
Chloride: 98 mmol/L — ABNORMAL LOW (ref 101–111)
GFR calc Af Amer: >60 mL/min (ref >60)
GLUCOSE: 101 mg/dL — AB (ref 65–99)
Potassium: 4 mmol/L (ref 3.5–5.1)
Sodium: 131 mmol/L — ABNORMAL LOW (ref 135–145)

## 2017-02-22 MED ORDER — SULFAMETHOXAZOLE-TRIMETHOPRIM 800-160 MG PO TABS
1.0000 | ORAL_TABLET | Freq: Two times a day (BID) | ORAL | Status: DC
  Administered 2017-02-22 – 2017-02-23 (×3): 1 via ORAL
  Filled 2017-02-22 (×3): qty 1

## 2017-02-22 NOTE — Progress Notes (Signed)
Foley clamped at 1330.  Pt stated she had the urge to void.  Foley unclamped.

## 2017-02-22 NOTE — Progress Notes (Signed)
Maria Chavez AES:[id number] DOB: Mar 06, 1966 DOA: 02/18/2017 PCP: No PCP Per Patient  Brief narrative:  50 ? Known h/o crohn's s/p colectomy [facility]  fbroids H/o lithotripsy -seen in [place] in the past Dr. Lora Havens had Lithotripsy in the past as well  Admit 2/23-2/28 with nephrolothiasis-high grade L Ureteric obs with 12 mm stone--had 24 x 6 Fr double J stent placed and d/c with Nitrofurantoin--this was to Rx Klebsiella Pyelo Return to ED fever n/v severe abd pain and weakness 3.2 Urology consulted-not rec  Op management and made recs for foley, flomax 0.4, ditropan 5 tid, pyridium Has made slow progress   Past medical history-As per Problem list Chart reviewed as below-   Consultants:  Urology  Procedures:  none  Antibiotics:  Ceftriaxone 3/3   Subjective   Better Eating Still some discomfort Multiple q's answered   Objective    Interim History:   Telemetry: nad   Objective: Vitals:   02/21/17 1732 02/21/17 1901 02/21/17 2158 02/22/17 0549  BP:   111/68 109/64  Pulse:   97 86  Resp:   18 18  Temp: 99.5 F (37.5 C) 98.6 F (37 C) 99.5 F (37.5 C) 98.8 F (37.1 C)  TempSrc: Oral Axillary Oral Oral  SpO2:   96% 97%  Weight:      Height:        Intake/Output Summary (Last 24 hours) at 02/22/17 1304 Last data filed at 02/22/17 0550  Gross per 24 hour  Intake              650 ml  Output             2300 ml  Net            -1650 ml    Exam: Alert oriented nontoxic appearing and in neck soft supple chest clinically clear S1-S2 no murmur rub or gallop abdomen soft Abdominal soft nontender moving all 4 limbs equally smile symmetric no rash  Data Reviewed: Basic Metabolic Panel:  Recent Labs Lab 02/18/17 2307 02/18/17 2312 02/19/17 0140 02/19/17 2207 02/20/17 0512 02/21/17 0516 02/22/17 0451  NA 132*  --   --   --  133*  135 130* 131*  K 2.7*  --  2.6* 3.5 4.2  4.2 4.0 4.0  CL 95*  --   --   --  105  108 98* 98*  CO2 29  --   --   --   '22  23 24 22  ' GLUCOSE 111*  --   --   --  118*  129* 104* 101*  BUN 11  --   --   --  5*  6 5* 7  CREATININE 1.01*  --   --   --  0.83  0.84 0.80 0.81  CALCIUM 9.2  --   --   --  8.1*  8.3* 8.6* 9.1  MG  --  1.7 1.7 2.2  --   --   --   PHOS  --   --   --   --  1.0* 2.8  --    Liver Function Tests:  Recent Labs Lab 02/18/17 2307 02/20/17 0512 02/21/17 0516  AST 30 28 55*  ALT 34 28 50  ALKPHOS 156* 110 126  BILITOT 2.0* 1.5* 1.3*  PROT 6.8 5.6* 6.0*  ALBUMIN 2.8* 1.9*  2.0* 2.3*    Recent Labs Lab 02/18/17 2307  LIPASE 72*   No results for input(s): AMMONIA in the last 168 hours.  CBC:  Recent Labs Lab 02/18/17 2307 02/20/17 0512 02/21/17 0516 02/22/17 0451  WBC 22.3* 17.2* 17.3* 16.2*  NEUTROABS  --   --  15.5*  --   HGB 12.2 9.5* 10.4* 10.9*  HCT 34.6* 27.6* 30.3* 32.2*  MCV 82.0 83.1 83.9 83.9  PLT 246 313 358 410*   Cardiac Enzymes: No results for input(s): CKTOTAL, CKMB, CKMBINDEX, TROPONINI in the last 168 hours. BNP: Invalid input(s): POCBNP CBG:  Recent Labs Lab 02/19/17 2158  GLUCAP 139*    Recent Results (from the past 240 hour(s))  Urine culture     Status: None   Collection Time: 02/12/17  8:18 PM  Result Value Ref Range Status   Specimen Description URINE, CLEAN CATCH  Final   Special Requests NONE  Final   Culture   Final    NO GROWTH Performed at [facility], [place] [address], [place], [place] [address]    Report Status 02/14/2017 FINAL  Final  Surgical pcr screen     Status: None   Collection Time: 02/13/17  4:33 AM  Result Value Ref Range Status   MRSA, PCR NEGATIVE NEGATIVE Final   Staphylococcus aureus NEGATIVE NEGATIVE Final    Comment:        The Xpert SA Assay (FDA approved for NASAL specimens in patients over 4 years of age), is one component of a comprehensive surveillance program.  Test performance has been validated by [facility] for patients greater than or equal to 19 year old. It is not  intended to diagnose infection nor to guide or monitor treatment.   Urine culture     Status: Abnormal   Collection Time: 02/13/17  8:59 AM  Result Value Ref Range Status   Specimen Description CYSTOSCOPY  Final   Special Requests NONE  Final   Culture 60,000 COLONIES/mL KLEBSIELLA PNEUMONIAE (A)  Final   Report Status 02/16/2017 FINAL  Final   Organism ID, Bacteria KLEBSIELLA PNEUMONIAE (A)  Final      Susceptibility   Klebsiella pneumoniae - MIC*    AMPICILLIN >=32 RESISTANT Resistant     CEFAZOLIN <=4 SENSITIVE Sensitive     CEFEPIME <=1 SENSITIVE Sensitive     CEFTAZIDIME <=1 SENSITIVE Sensitive     CEFTRIAXONE <=1 SENSITIVE Sensitive     CIPROFLOXACIN <=0.25 SENSITIVE Sensitive     GENTAMICIN <=1 SENSITIVE Sensitive     IMIPENEM <=0.25 SENSITIVE Sensitive     TRIMETH/SULFA <=20 SENSITIVE Sensitive     AMPICILLIN/SULBACTAM 16 INTERMEDIATE Intermediate     PIP/TAZO <=4 SENSITIVE Sensitive     Extended ESBL NEGATIVE Sensitive     * 60,000 COLONIES/mL KLEBSIELLA PNEUMONIAE  Culture, blood (routine x 2)     Status: None   Collection Time: 02/13/17  9:43 AM  Result Value Ref Range Status   Specimen Description BLOOD LEFT HAND  Final   Special Requests BOTTLES DRAWN AEROBIC AND ANAEROBIC 5 CC EACH  Final   Culture   Final    NO GROWTH 5 DAYS Performed at [facility], [address], [place], [place] [address]    Report Status 02/18/2017 FINAL  Final  Culture, blood (routine x 2)     Status: None   Collection Time: 02/13/17  9:43 AM  Result Value Ref Range Status   Specimen Description BLOOD RIGHT HAND  Final   Special Requests BOTTLES DRAWN AEROBIC AND ANAEROBIC 5CC EACH  Final   Culture   Final    NO GROWTH 5  DAYS Performed at [facility], [place] [address], [place], [place] [address]    Report Status 02/18/2017 FINAL  Final  Urine culture     Status: Abnormal   Collection Time: 02/18/17 10:17 PM  Result Value Ref Range Status   Specimen Description URINE,  CLEAN CATCH  Final   Special Requests NONE  Final   Culture (A)  Final    <10,000 COLONIES/mL INSIGNIFICANT GROWTH Performed at [facility], [place] [address] [place], [place] [address]    Report Status 02/20/2017 FINAL  Final  Culture, blood (routine x 2)     Status: None (Preliminary result)   Collection Time: 02/19/17 10:40 AM  Result Value Ref Range Status   Specimen Description BLOOD LEFT ARM  Final   Special Requests BOTTLES DRAWN AEROBIC AND ANAEROBIC 3CC  Final   Culture   Final    NO GROWTH 2 DAYS Performed at [facility], [place] [address] [place], [place] [address]    Report Status PENDING  Incomplete  Culture, blood (routine x 2)     Status: None (Preliminary result)   Collection Time: 02/19/17 10:55 AM  Result Value Ref Range Status   Specimen Description BLOOD RIGHT ARM  Final   Special Requests BOTTLES DRAWN AEROBIC AND ANAEROBIC 3CC  Final   Culture   Final    NO GROWTH 2 DAYS Performed at [facility], [place] [address], [place], [place] [address]    Report Status PENDING  Incomplete     Studies:              All Imaging reviewed and is as per above notation   Scheduled Meds: . enoxaparin (LOVENOX) injection  40 mg Subcutaneous Q24H  . sulfamethoxazole-trimethoprim  1 tablet Oral Q12H  . tamsulosin  0.4 mg Oral Daily  . zolpidem  5 mg Oral QHS   Continuous Infusions:    Assessment/Plan:  1. Sepsis secondary to pyelonephritis-continue ceftriaxone. Urine culture from 3/1 shows less than 10,000 colony-forming units.  Rocephin D escalate cautiously to Bactrim and will treat for at least one week. allow regular diet only--Dr. Herrick/Urology resident to make final decision about if further in-patient care needed--If spiking fever or pain will need CT r/o renal abcess--clamp foley and d/c if not needed 2. abd pain-unclear if crohns flare.  No diarrhea and tol diet 3. Hypokalemia-on admission potassium 2.6 replaced initially with IV dextrose with  40 of K.--stopped replacement  4. hypophosphatemia replacing phosphorus with K-Phos-stopped replacement 5. Elevated alk phs/bilirubin 2-unclear etiology-? Sepsis-resolving-bili down--no work-up    Full code Keep inpatient for now. Likely d/c am if no further fevers or chills   Verneita Griffes, MD  [facility] Pager [phone] 02/22/2017, 1:04 PM    LOS: 3 days

## 2017-02-22 NOTE — Progress Notes (Signed)
Foley discontinued per MD order at 1800.  Pt due to void, night shift aware.

## 2017-02-22 NOTE — Consult Note (Signed)
Urology Consult Note   Requesting Attending Physician:  Nita Sells, MD Service Providing Consult: Urology Consulting Attending: Louis Meckel  Assessment:  Patient is a 51 y.o. female with probable left pyelonephritis s/p left ureteral stent placement 02/13/17 for left obstructing ureteral calculus in setting of Klebsiella UTI. Patient was likely discharged on inadequate antibiotic treatment with nitrofurantoin after previous admission. Nitrofurantoin has poor tissue penetrance and would not adequately cover pyelonephritis in setting of complicated UTI. WBC 22. Ureteral stent in good position on Ct.   Interval: Tmax 100.5 yesterday afternoon. Normotensive. 3.1L UOP via Foley. Cr 0.81. WBC 16.2 from 17. Now transitioned from rocephin to bactrim Po. Urine culture 3/1 with <10000 CFU.   Recommendations: 1. Agree with bactrim Po. Will assume that pyelonephritis secondary to klebsiella that grew on previous urine culture. Would recommend 14 total day course of antibiotics 2. Foley catheter per primary team. Consider discontinuation and TOV either later today or tomorrow morning 3. No further urologic procedure or intervention required during this hospitalization as ureteral stent in appropriate position. Her stones are almost certainly not contributing to infection and definitive management will be deferred 1-2 weeks until she is adequately treated for pyelo 4. Recommend ureteral stent discomfort meds - flomax 0.4mg  qd PRN stent discomfort, ditropan 5mg  TID PRN bladder spasm, pyridium 100mg  TID PRN dysuria (for 3 days) 5. Consider re-imaging with renal u/s or CT abdomen with IV contrast if she continues to spike fevers or is not responding by 3/6 to assess for renal abscess formation, although would not lean towards this based on her current clinical condition on our visit 3/5 6. Follow up with Dr. Louis Meckel has been requested in 1-2 weeks  Thank you for this consult. Please contact the urology  consult pager with any further questions/concerns. Sharmaine Base, MD Urology Surgical Resident  Subjective Feels much better today. Just ate some breakfast. Was battling nausea yesterday. Continued flank and left lower back pain, but pain meds helping, currently 5/10. Foley still in place. Ambulating.  Objective   Vital signs in last 24 hours: BP 109/64 (BP Location: Left Arm)   Pulse 86   Temp 98.8 F (37.1 C) (Oral)   Resp 18   Ht 5\' 3"  (1.6 m)   Wt 61.5 kg (135 lb 9.3 oz)   SpO2 97%   BMI 24.02 kg/m    Intake/Output last 3 shifts: I/O last 3 completed shifts: In: 1304.2 [P.O.:840; I.V.:364.2; IV Piggyback:100] Out: 5400 [Urine:5400]  Physical Exam General: NAD, A&O, resting, appropriate HEENT: [place]/AT, EOMI, MMM Pulmonary: Normal work of breathing on RA Cardiovascular: Regular rate & rhythm, HDS, adequate peripheral perfusion Abdomen: soft, NTTP, nondistended, no suprapubic fullness or tenderness GU:  Foley with clear yellow urine, significant left CVA tenderness and CVAT referred to the left when assessed on the right Extremities: warm and well perfused, no edema Neuro: Appropriate, no focal neurological deficits  Most Recent Labs: Lab Results  Component Value Date   WBC 16.2 (H) 02/22/2017   HGB 10.9 (L) 02/22/2017   HCT 32.2 (L) 02/22/2017   PLT 410 (H) 02/22/2017    Lab Results  Component Value Date   NA 131 (L) 02/22/2017   K 4.0 02/22/2017   CL 98 (L) 02/22/2017   CO2 22 02/22/2017   BUN 7 02/22/2017   CREATININE 0.81 02/22/2017   CALCIUM 9.1 02/22/2017   MG 2.2 02/19/2017   PHOS 2.8 02/21/2017    Lab Results  Component Value Date   ALKPHOS 126 02/21/2017   BILITOT  1.3 (H) 02/21/2017   PROT 6.0 (L) 02/21/2017   ALBUMIN 2.3 (L) 02/21/2017   ALT 50 02/21/2017   AST 55 (H) 02/21/2017    No results found for: INR, APTT   Urine Culture: 3/1 <10k CFU  2/24 - 60k Klebsiella pn Culture 60,000 COLONIES/mL KLEBSIELLA PNEUMONIAE    Report Status  02/16/2017 FINAL   Organism ID, Bacteria KLEBSIELLA PNEUMONIAE    Resulting Agency [facility]  Susceptibility    Klebsiella pneumoniae    MIC    AMPICILLIN >=32 RESIST... Resistant    AMPICILLIN/SULBACTAM 16 INTERMED... Intermediate    CEFAZOLIN <=4 SENSITIVE "><=4 SENSITIVE  Sensitive    CEFEPIME <=1 SENSITIVE "><=1 SENSITIVE  Sensitive    CEFTAZIDIME <=1 SENSITIVE "><=1 SENSITIVE  Sensitive    CEFTRIAXONE <=1 SENSITIVE "><=1 SENSITIVE  Sensitive    CIPROFLOXACIN <=0.25 SENSITIVE "><=0.25 SENS... Sensitive    Extended ESBL NEGATIVE  Sensitive    GENTAMICIN <=1 SENSITIVE "><=1 SENSITIVE  Sensitive    IMIPENEM <=0.25 SENSITIVE "><=0.25 SENS... Sensitive    PIP/TAZO <=4 SENSITIVE "><=4 SENSITIVE  Sensitive    TRIMETH/SULFA <=20 SENSITIVE "><=20 SENSIT... Sensitive         IMAGING: 1. CT Renal Stone Study (02/19/17) - IMPRESSION: 1. Placement of a left ureteral stent with resolved hydronephrosis. A 65mm stone fragment persists in the renal pelvis with smaller fragment in the lower left kidney. Persistent edematous appearance of the left kidney suspicious for concurrent pyelonephritis. 2. Bilateral pleural effusions and bibasilar opacities at the lung bases are only partially included.

## 2017-02-23 ENCOUNTER — Other Ambulatory Visit: Payer: Self-pay | Admitting: Urology

## 2017-02-23 ENCOUNTER — Encounter (HOSPITAL_COMMUNITY)
Admission: EM | Disposition: A | Discharge status: Home / Self Care | Payer: Self-pay | Source: Home / Self Care | Attending: Family Medicine

## 2017-02-23 LAB — CBC
HCT: 33.2 % — ABNORMAL LOW (ref 36.0–46.0)
Hemoglobin: 11.2 g/dL — ABNORMAL LOW (ref 12.0–15.0)
MCH: 28 pg (ref 26.0–34.0)
MCHC: 33.7 g/dL (ref 30.0–36.0)
MCV: 83 fL (ref 78.0–100.0)
Platelets: 439 10*3/uL — ABNORMAL HIGH (ref 150–400)
RBC: 4 MIL/uL (ref 3.87–5.11)
RDW: 14.3 % (ref 11.5–15.5)
WBC: 14.5 10*3/uL — ABNORMAL HIGH (ref 4.0–10.5)

## 2017-02-23 LAB — RENAL FUNCTION PANEL
Albumin: 2.6 g/dL — ABNORMAL LOW (ref 3.5–5.0)
Anion gap: 10 (ref 5–15)
BUN: 10 mg/dL (ref 6–20)
CHLORIDE: 97 mmol/L — AB (ref 101–111)
CO2: 24 mmol/L (ref 22–32)
CREATININE: 0.97 mg/dL (ref 0.44–1.00)
Calcium: 9.4 mg/dL (ref 8.9–10.3)
GFR calc Af Amer: >60 mL/min (ref >60)
GLUCOSE: 115 mg/dL — AB (ref 65–99)
PHOSPHORUS: 3 mg/dL (ref 2.5–4.6)
POTASSIUM: 3.5 mmol/L (ref 3.5–5.1)
Sodium: 131 mmol/L — ABNORMAL LOW (ref 135–145)

## 2017-02-23 SURGERY — CYSTOURETEROSCOPY, WITH RETROGRADE PYELOGRAM AND STENT INSERTION
Anesthesia: General | Laterality: Left

## 2017-02-23 MED ORDER — SULFAMETHOXAZOLE-TRIMETHOPRIM 800-160 MG PO TABS: 1.0000 | ORAL_TABLET | Freq: Two times a day (BID) | ORAL | 0 refills | Status: DC

## 2017-02-23 MED ORDER — OXYCODONE HCL 5 MG PO TABS: 10.0000 mg | ORAL_TABLET | ORAL | 0 refills | Status: DC | PRN

## 2017-02-23 MED ORDER — PROMETHAZINE HCL 12.5 MG PO TABS: 12.5000 mg | ORAL_TABLET | Freq: Four times a day (QID) | ORAL | 0 refills | Status: DC | PRN

## 2017-02-23 NOTE — Discharge Summary (Addendum)
.  Physician Discharge Summary  Maria Chavez GMW:[id number] DOB: 1966-05-28 DOA: 02/18/2017  PCP: No PCP Per Patient  Admit date: 02/18/2017 Discharge date: 02/23/2017  Time spent: 35 minutes  Recommendations for Outpatient Follow-up:  1. Complete Bactrim DS 14 days  2. OP follow up urology 3. limited Rx phenrgan and opiates given on d/c  Discharge Diagnoses:  Principal Problem:   Acute pyelonephritis Active Problems:   Nausea & vomiting   Hypokalemia   Pyelonephritis   Discharge Condition: improved  Diet recommendation: hh low salt  Filed Weights   02/18/17 2046 02/19/17 1119  Weight: 61.2 kg (135 lb) 61.5 kg (135 lb 9.3 oz)    History of present illness:  50 ? Known h/o crohn's s/p colectomy [facility]  fbroids H/o lithotripsy -seen in [place] in the past Dr. Lora Havens had Lithotripsy in the past as well             Admit 2/23-2/28 with nephrolothiasis-high grade L Ureteric obs with 12 mm stone--had 24 x 6 Fr double J stent placed and d/c with Nitrofurantoin--this was to Rx Klebsiella Pyelo Return to ED fever n/v severe abd pain and weakness 3.2 Urology consulted-not rec  Op management and made recs for foley, flomax 0.4, ditropan 5 tid, pyridium Has made slow progress  Hospital Course:   1. Sepsis secondary to pyelonephritis-was one ceftriaxone. Urine culture from 3/1 shows less than 10,000 colony-forming units.  Rocephin D escalate cautiously to Bactrim and will treat for at least 2 weeks as op. allow regular diet only--Dr. Herrick/ to set up OP stone removal 2. abd pain-unclear if crohns flare.  given phenergan on d/c home 3. Hypokalemia-on admission potassium 2.6 replaced initially with IV dextrose with 40 of K.--stopped replacement  4. hypophosphatemia replacing phosphorus with K-Phos-stopped replacement 5. Elevated alk phs/bilirubin 2-unclear etiology-? Sepsis-resolving-bili down--no work-up   Procedures: none Consultations:  Urology  Discharge Exam: Vitals:    02/22/17 2221 02/23/17 0435  BP: 110/71 103/68  Pulse: 99 (!) 101  Resp: 18 20  Temp: 99.5 F (37.5 C) 98.7 F (37.1 C)    General: alert pleasant oriented in nad Cardiovascular:  s1 s2 no m/r/g Respiratory:  Clear no added sound  Discharge Instructions   Discharge Instructions    Diet - low sodium heart healthy    Complete by:  As directed    Discharge instructions    Complete by:  As directed    Complete bactrim in 14 days and take all meds-Urologist Dr. Louis Meckel should be setting up an APPT to see you as an OP We will send you home with Phenergan and some pain meds for the short term   Increase activity slowly    Complete by:  As directed      Current Discharge Medication List    START taking these medications   Details  oxyCODONE (OXY IR/ROXICODONE) 5 MG immediate release tablet Take 2 tablets (10 mg total) by mouth every 3 (three) hours as needed for moderate pain. Qty: 15 tablet, Refills: 0    promethazine (PHENERGAN) 12.5 MG tablet Take 1 tablet (12.5 mg total) by mouth every 6 (six) hours as needed for nausea or vomiting. Qty: 30 tablet, Refills: 0    sulfamethoxazole-trimethoprim (BACTRIM DS,SEPTRA DS) 800-160 MG tablet Take 1 tablet by mouth every 12 (twelve) hours. Qty: 28 tablet, Refills: 0      CONTINUE these medications which have NOT CHANGED   Details  oxyCODONE-acetaminophen (PERCOCET/ROXICET) 5-325 MG tablet Take 1 tablet by mouth every  4 (four) hours as needed for moderate pain or severe pain. Qty: 30 tablet, Refills: 0    tamsulosin (FLOMAX) 0.4 MG CAPS capsule Take 0.4 mg by mouth daily.       STOP taking these medications     nitrofurantoin, macrocrystal-monohydrate, (MACROBID) 100 MG capsule        No Known Allergies    The results of significant diagnostics from this hospitalization (including imaging, microbiology, ancillary and laboratory) are listed below for reference.    Significant Diagnostic Studies: Dg Chest 2 View  Result  Date: 02/19/2017 CLINICAL DATA:  Decreased O2 saturation.  Initial encounter. EXAM: CHEST  2 VIEW COMPARISON:  None. FINDINGS: The lungs are well-aerated. Patchy basilar airspace opacification is suggested on the lateral view, raising concern for pneumonia. There is no evidence of pleural effusion or pneumothorax. The heart is borderline enlarged. No acute osseous abnormalities are seen. IMPRESSION: Patchy basilar airspace opacification suggested on the lateral view, raising concern for pneumonia. Borderline cardiomegaly. Electronically Signed   By: Garald Balding M.D.   On: 02/19/2017 02:49   Ct Renal Stone Study  Result Date: 02/19/2017 CLINICAL DATA:  Left flank pain and hematuria. EXAM: CT ABDOMEN AND PELVIS WITHOUT CONTRAST TECHNIQUE: Multidetector CT imaging of the abdomen and pelvis was performed following the standard protocol without IV contrast. COMPARISON:  CT 8 days prior 02/12/2017 FINDINGS: Lower chest: Small bilateral pleural effusions. Associated bibasilar opacities, only partially included. Hepatobiliary: No evidence of focal lesion allowing for lack contrast. The liver appears prominent size. Clips in the gallbladder fossa postcholecystectomy. No biliary dilatation. Pancreas: No ductal dilatation or inflammation. Spleen: Upper normal in size spanning 12.8 cm. Adrenals/Urinary Tract: Left ureteral stent in place, proximal pigtail in the renal pelvis, distal pigtail in the bladder. There is a 9 mm stone fragment adjacent to the proximal loop in the renal pelvis. 4 mm stone in the left lower left kidney. Left kidney remains enlarged and edematous with perinephric edema. There is no residual renal collecting system dilatation. Right kidney is normal in size without urolithiasis. Urinary bladder is minimally distended without wall thickening. Stomach/Bowel: Small hiatal hernia. Enteric sutures in the ascending colon likely post ileocecectomy. Enteric sutures in the sigmoid colon. No evidence of bowel  inflammation or obstruction. There is stool throughout the colon. Vascular/Lymphatic: Aortic atherosclerosis without aneurysm. Small retroperitoneal nodes on the left. Reproductive: Uterus is mildly bulbous in appearance. No evidence of adnexal mass. Other: No free air, free fluid, or intra-abdominal fluid collection. Musculoskeletal: There are no acute or suspicious osseous abnormalities. IMPRESSION: 1. Placement of a left ureteral stent with resolved hydronephrosis. A 73m stone fragment persists in the renal pelvis with smaller fragment in the lower left kidney. Persistent edematous appearance of the left kidney suspicious for concurrent pyelonephritis. 2. Bilateral pleural effusions and bibasilar opacities at the lung bases are only partially included. Electronically Signed   By: MJeb LeveringM.D.   On: 02/19/2017 03:14   Ct Renal Stone Study  Result Date: 02/12/2017 CLINICAL DATA:  Initial evaluation for acute left flank pain for 4 days. Per history, recent CT scan with IV contrast. EXAM: CT ABDOMEN AND PELVIS WITHOUT CONTRAST TECHNIQUE: Multidetector CT imaging of the abdomen and pelvis was performed following the standard protocol without IV contrast. COMPARISON:  None available. FINDINGS: Lower chest: Small layering left pleural effusion with associated atelectasis. Scattered atelectatic changes with trace effusion at the right lung base is well. Hepatobiliary: The liver demonstrates a normal unenhanced appearance. Gallbladder surgically absent. Prominence  of the common bile duct like related to post cholecystectomy changes. Pancreas: Pancreas within normal limits. Spleen: Spleen within normal limits. Adrenals/Urinary Tract: Adrenal glands are normal. Left kidney is asymmetrically enlarged as compared to the right. Parenchymal contrast staining present within left kidney with secrete IV contrast material within the proximal left renal collecting system. There is an obstructive stone measuring  approximately 12 mm at the left UPJ (series 5, image 36). No IV contrast material seen distally within the left ureter. Associated perinephric and periureteral fat stranding. Moderate to severe left hydronephrosis. No other definite stones within left kidney, although evaluation limited by presence of IV contrast. Small amount secrete IV contrast material present within the right renal collecting system. No definite right-sided renal or ureteral calculi. No right-sided hydronephrosis or hydroureter. Bladder partially distended with secreted contrast material within the bladder lumen. Stomach/Bowel: Stomach within normal limits. No evidence for bowel obstruction. Appendix appears to be surgically absent. Additional anastomotic suture present about the sigmoid colon. No acute inflammatory changes about the bowels. Scattered free fluid approximates the descending colon related to the obstructive process within the left kidney. Vascular/Lymphatic: Mild aortic atherosclerosis. No aneurysm. No adenopathy. Reproductive: Uterus and ovaries within normal limits. Other: No free air. Small amount of free fluid related to the obstructive process within the left kidney. Tiny fat containing paraumbilical hernia noted. Musculoskeletal: No acute osseus abnormality. No worrisome lytic or blastic osseous lesions. Central disc protrusion noted at L5-S1. IMPRESSION: 1. 12 mm obstructive left UPJ stone with secondary moderate to severe left hydronephrosis. Retained IV contrast material on board from recent CT. 2. Small layering left pleural effusion with associated atelectasis. 3. Central disc protrusion at L5-S1. Electronically Signed   By: Jeannine Boga M.D.   On: 02/12/2017 21:43    Microbiology: Recent Results (from the past 240 hour(s))  Urine culture     Status: Abnormal   Collection Time: 02/18/17 10:17 PM  Result Value Ref Range Status   Specimen Description URINE, CLEAN CATCH  Final   Special Requests NONE   Final   Culture (A)  Final    <10,000 COLONIES/mL INSIGNIFICANT GROWTH Performed at [facility], [address], [place], [place] [address]    Report Status 02/20/2017 FINAL  Final  Culture, blood (routine x 2)     Status: None (Preliminary result)   Collection Time: 02/19/17 10:40 AM  Result Value Ref Range Status   Specimen Description BLOOD LEFT ARM  Final   Special Requests BOTTLES DRAWN AEROBIC AND ANAEROBIC 3CC  Final   Culture   Final    NO GROWTH 3 DAYS Performed at [facility], [place] [address] [place], [place] [address]    Report Status PENDING  Incomplete  Culture, blood (routine x 2)     Status: None (Preliminary result)   Collection Time: 02/19/17 10:55 AM  Result Value Ref Range Status   Specimen Description BLOOD RIGHT ARM  Final   Special Requests BOTTLES DRAWN AEROBIC AND ANAEROBIC 3CC  Final   Culture   Final    NO GROWTH 3 DAYS Performed at [facility], [place] [address], [place], [place] [address]    Report Status PENDING  Incomplete     Labs: Basic Metabolic Panel:  Recent Labs Lab 02/18/17 2307 02/18/17 2312 02/19/17 0140 02/19/17 2207 02/20/17 0512 02/21/17 0516 02/22/17 0451 02/23/17 0532  NA 132*  --   --   --  133*  135 130* 131* 131*  K  2.7*  --  2.6* 3.5 4.2  4.2 4.0 4.0 3.5  CL 95*  --   --   --  105  108 98* 98* 97*  CO2 29  --   --   --  '22  23 24 22 24  ' GLUCOSE 111*  --   --   --  118*  129* 104* 101* 115*  BUN 11  --   --   --  5*  6 5* 7 10  CREATININE 1.01*  --   --   --  0.83  0.84 0.80 0.81 0.97  CALCIUM 9.2  --   --   --  8.1*  8.3* 8.6* 9.1 9.4  MG  --  1.7 1.7 2.2  --   --   --   --   PHOS  --   --   --   --  1.0* 2.8  --  3.0   Liver Function Tests:  Recent Labs Lab 02/18/17 2307 02/20/17 0512 02/21/17 0516 02/23/17 0532  AST 30 28 55*  --   ALT 34 28 50  --   ALKPHOS 156* 110 126  --   BILITOT 2.0* 1.5* 1.3*  --   PROT 6.8 5.6* 6.0*  --   ALBUMIN 2.8* 1.9*  2.0* 2.3* 2.6*    Recent  Labs Lab 02/18/17 2307  LIPASE 72*   No results for input(s): AMMONIA in the last 168 hours. CBC:  Recent Labs Lab 02/18/17 2307 02/20/17 0512 02/21/17 0516 02/22/17 0451 02/23/17 0532  WBC 22.3* 17.2* 17.3* 16.2* 14.5*  NEUTROABS  --   --  15.5*  --   --   HGB 12.2 9.5* 10.4* 10.9* 11.2*  HCT 34.6* 27.6* 30.3* 32.2* 33.2*  MCV 82.0 83.1 83.9 83.9 83.0  PLT 246 313 358 410* 439*   Cardiac Enzymes: No results for input(s): CKTOTAL, CKMB, CKMBINDEX, TROPONINI in the last 168 hours. BNP: BNP (last 3 results) No results for input(s): BNP in the last 8760 hours.  ProBNP (last 3 results) No results for input(s): PROBNP in the last 8760 hours.  CBG:  Recent Labs Lab 02/19/17 2158  GLUCAP 139*       Signed:  Nita Sells MD   [facility] 02/23/2017, 9:54 AM

## 2017-02-23 NOTE — Progress Notes (Signed)
Urology Inpatient Progress Report  Hypokalemia [E87.6] Kidney stone [N20.0] Pyelonephritis [N12] Hematuria [R31.9] Acute pyelonephritis [N10]     Intv/Subj: No acute events overnight. Patient is  Complaining of left flank pain and overall malaise.   Principal Problem:   Acute pyelonephritis Active Problems:   Nausea & vomiting   Hypokalemia   Pyelonephritis  Current Facility-Administered Medications  Medication Dose Route Frequency Provider Last Rate Last Dose  . acetaminophen (TYLENOL) tablet 650 mg  650 mg Oral Q6H PRN Nayana Abrol, MD   650 mg at 02/19/17 1828   Or  . acetaminophen (TYLENOL) suppository 650 mg  650 mg Rectal Q6H PRN Nayana Abrol, MD      . enoxaparin (LOVENOX) injection 40 mg  40 mg Subcutaneous Q24H Nayana Abrol, MD   40 mg at 02/22/17 1243  . levalbuterol (XOPENEX) nebulizer solution 0.63 mg  0.63 mg Nebulization Q6H PRN Nayana Abrol, MD      . morphine 4 MG/ML injection 1 mg  1 mg Intravenous Q4H PRN Jai-Gurmukh Samtani, MD   1 mg at 02/20/17 1502  . ondansetron (ZOFRAN) tablet 4 mg  4 mg Oral Q6H PRN Nayana Abrol, MD       Or  . ondansetron (ZOFRAN) injection 4 mg  4 mg Intravenous Q6H PRN Nayana Abrol, MD   4 mg at 02/22/17 1803  . oxybutynin (DITROPAN) tablet 5 mg  5 mg Oral Q8H PRN Matthew R Macey, MD   5 mg at 02/19/17 1301  . oxyCODONE (Oxy IR/ROXICODONE) immediate release tablet 5-10 mg  5-10 mg Oral Q3H PRN Jai-Gurmukh Samtani, MD   10 mg at 02/23/17 0813  . sulfamethoxazole-trimethoprim (BACTRIM DS,SEPTRA DS) 800-160 MG per tablet 1 tablet  1 tablet Oral Q12H Jai-Gurmukh Samtani, MD   1 tablet at 02/22/17 2236  . tamsulosin (FLOMAX) capsule 0.4 mg  0.4 mg Oral Daily Nayana Abrol, MD   0.4 mg at 02/22/17 0933  . tamsulosin (FLOMAX) capsule 0.4 mg  0.4 mg Oral Daily PRN Matthew R Macey, MD   0.4 mg at 02/20/17 0127  . zolpidem (AMBIEN) tablet 5 mg  5 mg Oral QHS Jai-Gurmukh Samtani, MD   5 mg at 02/22/17 2347     Objective: Vital: Vitals:   02/22/17 0549 02/22/17 1300 02/22/17 2221 02/23/17 0435  BP: 109/64 113/74 110/71 103/68  Pulse: 86 96 99 (!) 101  Resp: 18 18 18 20  Temp: 98.8 F (37.1 C) 98.1 F (36.7 C) 99.5 F (37.5 C) 98.7 F (37.1 C)  TempSrc: Oral Oral Oral Oral  SpO2: 97% 100% 97% 97%  Weight:      Height:       I/Os: I/O last 3 completed shifts: In: 850 [P.O.:800; IV Piggyback:50] Out: 2951 [Urine:2951]  Physical Exam:  General: Patient is in no apparent distress Lungs: Normal respiratory effort, chest expands symmetrically. The patient has significant left-sided flank tenderness Ext: lower extremities symmetric  Lab Results:  Recent Labs  02/21/17 0516 02/22/17 0451 02/23/17 0532  WBC 17.3* 16.2* 14.5*  HGB 10.4* 10.9* 11.2*  HCT 30.3* 32.2* 33.2*    Recent Labs  02/21/17 0516 02/22/17 0451 02/23/17 0532  NA 130* 131* 131*  K 4.0 4.0 3.5  CL 98* 98* 97*  CO2 24 22 24  GLUCOSE 104* 101* 115*  BUN 5* 7 10  CREATININE 0.80 0.81 0.97  CALCIUM 8.6* 9.1 9.4   No results for input(s): LABPT, INR in the last 72 hours. No results for input(s): LABURIN in the last   72 hours. Results for orders placed or performed during the hospital encounter of 02/18/17  Urine culture     Status: Abnormal   Collection Time: 02/18/17 10:17 PM  Result Value Ref Range Status   Specimen Description URINE, CLEAN CATCH  Final   Special Requests NONE  Final   Culture (A)  Final    <10,000 COLONIES/mL INSIGNIFICANT GROWTH Performed at [facility], [address] [place], [place] [address]    Report Status 02/20/2017 FINAL  Final  Culture, blood (routine x 2)     Status: None (Preliminary result)   Collection Time: 02/19/17 10:40 AM  Result Value Ref Range Status   Specimen Description BLOOD LEFT ARM  Final   Special Requests BOTTLES DRAWN AEROBIC AND ANAEROBIC 3CC  Final   Culture   Final    NO GROWTH 3 DAYS Performed at [facility], [address] [place], [place] [address]    Report  Status PENDING  Incomplete  Culture, blood (routine x 2)     Status: None (Preliminary result)   Collection Time: 02/19/17 10:55 AM  Result Value Ref Range Status   Specimen Description BLOOD RIGHT ARM  Final   Special Requests BOTTLES DRAWN AEROBIC AND ANAEROBIC 3CC  Final   Culture   Final    NO GROWTH 3 DAYS Performed at [facility], [address] [place], [place] [address]    Report Status PENDING  Incomplete    Studies/Results: No results found.  Assessment/Plan: The patient is still recovering from left-sided pyelonephritis.  At this point, she needs to convalesce more prior to proceeding to the operating room.  As such, I would recommend that the patient be discharged home with a total of 2 weeks of Bactrim, we will get her scheduled for definitive stone treatment within the next 7-14 days.     Najmah Carradine, MD Urology 02/23/2017, 8:43 AM 

## 2017-02-23 NOTE — Progress Notes (Signed)
Discharge instructions reviewed with patient. Patient verbalized understanding. PAtient to be discharged via private vehicle.

## 2017-02-24 ENCOUNTER — Encounter (HOSPITAL_BASED_OUTPATIENT_CLINIC_OR_DEPARTMENT_OTHER): Payer: Self-pay | Admitting: *Deleted

## 2017-02-24 LAB — CULTURE, BLOOD (ROUTINE X 2)
CULTURE: NO GROWTH
Culture: NO GROWTH

## 2017-02-26 ENCOUNTER — Encounter (HOSPITAL_BASED_OUTPATIENT_CLINIC_OR_DEPARTMENT_OTHER): Payer: Self-pay | Admitting: *Deleted

## 2017-02-26 NOTE — Progress Notes (Signed)
NPO AFTER MN WITH EXCEPTION CLEAR LIQUIDS UNTIL 0800 (NO CREAM [place] PRODUCTS).  ARRIVE AT 3112.  CURRENT LAB RESULTS AND EKG IN CHART AND EPIC.  MAY TAKE PAIN RX IF NEEDED AM DOS W/ SIPS OF WATER.

## 2017-03-03 ENCOUNTER — Encounter (HOSPITAL_BASED_OUTPATIENT_CLINIC_OR_DEPARTMENT_OTHER)
Admission: RE | Disposition: A | Discharge status: Home / Self Care | Payer: Self-pay | Source: Ambulatory Visit | Attending: Urology

## 2017-03-03 ENCOUNTER — Ambulatory Visit (HOSPITAL_BASED_OUTPATIENT_CLINIC_OR_DEPARTMENT_OTHER): Payer: Self-pay | Admitting: Anesthesiology

## 2017-03-03 ENCOUNTER — Encounter (HOSPITAL_BASED_OUTPATIENT_CLINIC_OR_DEPARTMENT_OTHER): Payer: Self-pay | Admitting: *Deleted

## 2017-03-03 ENCOUNTER — Ambulatory Visit (HOSPITAL_BASED_OUTPATIENT_CLINIC_OR_DEPARTMENT_OTHER)
Admission: RE | Admit: 2017-03-03 | Discharge: 2017-03-03 | Disposition: A | Discharge status: Home / Self Care | Payer: Self-pay | Source: Ambulatory Visit | Attending: Urology | Admitting: Urology

## 2017-03-03 DIAGNOSIS — R319 Hematuria, unspecified: Secondary | ICD-10-CM | POA: Insufficient documentation

## 2017-03-03 DIAGNOSIS — N201 Calculus of ureter: Secondary | ICD-10-CM | POA: Insufficient documentation

## 2017-03-03 DIAGNOSIS — Z87891 Personal history of nicotine dependence: Secondary | ICD-10-CM | POA: Insufficient documentation

## 2017-03-03 DIAGNOSIS — N10 Acute tubulo-interstitial nephritis: Secondary | ICD-10-CM | POA: Insufficient documentation

## 2017-03-03 DIAGNOSIS — E876 Hypokalemia: Secondary | ICD-10-CM | POA: Insufficient documentation

## 2017-03-03 HISTORY — DX: Personal history of urinary (tract) infections: Z87.440

## 2017-03-03 HISTORY — DX: Left bundle-branch block, unspecified: I44.7

## 2017-03-03 HISTORY — DX: Presence of spectacles and contact lenses: Z97.3

## 2017-03-03 HISTORY — DX: Personal history of other diseases of urinary system: Z87.448

## 2017-03-03 HISTORY — PX: CYSTOSCOPY/URETEROSCOPY/HOLMIUM LASER/STENT PLACEMENT: SHX6546

## 2017-03-03 HISTORY — DX: Personal history of urinary calculi: Z87.442

## 2017-03-03 HISTORY — DX: Unspecified abdominal pain: R10.9

## 2017-03-03 HISTORY — DX: Calculus of ureter: N20.1

## 2017-03-03 SURGERY — CYSTOSCOPY/URETEROSCOPY/HOLMIUM LASER/STENT PLACEMENT
Anesthesia: General | Site: Renal | Laterality: Left

## 2017-03-03 MED ORDER — CIPROFLOXACIN IN D5W 400 MG/200ML IV SOLN
400.0000 mg | INTRAVENOUS | Status: AC
  Administered 2017-03-03: 400 mg via INTRAVENOUS
  Filled 2017-03-03: qty 200

## 2017-03-03 MED ORDER — SODIUM CHLORIDE 0.9 % IR SOLN
Status: DC | PRN
  Administered 2017-03-03: 4000 mL

## 2017-03-03 MED ORDER — ONDANSETRON HCL 4 MG/2ML IJ SOLN
INTRAMUSCULAR | Status: DC | PRN
  Administered 2017-03-03: 4 mg via INTRAVENOUS

## 2017-03-03 MED ORDER — OXYCODONE HCL 5 MG/5ML PO SOLN
5.0000 mg | Freq: Once | ORAL | Status: DC | PRN
Start: 2017-03-03 — End: 2017-03-03
  Filled 2017-03-03: qty 5

## 2017-03-03 MED ORDER — MIDAZOLAM HCL 5 MG/5ML IJ SOLN
INTRAMUSCULAR | Status: DC | PRN
  Administered 2017-03-03: 2 mg via INTRAVENOUS

## 2017-03-03 MED ORDER — LACTATED RINGERS IV SOLN
INTRAVENOUS | Status: DC
  Administered 2017-03-03 (×2): via INTRAVENOUS
  Filled 2017-03-03: qty 1000

## 2017-03-03 MED ORDER — LIDOCAINE HCL 2 % EX GEL
CUTANEOUS | Status: DC | PRN
  Administered 2017-03-03: 1 via URETHRAL

## 2017-03-03 MED ORDER — PHENAZOPYRIDINE HCL 200 MG PO TABS: 200.0000 mg | ORAL_TABLET | Freq: Three times a day (TID) | ORAL | 0 refills | Status: DC | PRN

## 2017-03-03 MED ORDER — LIDOCAINE 2% (20 MG/ML) 5 ML SYRINGE
INTRAMUSCULAR | Status: DC | PRN
  Administered 2017-03-03: 40 mg via INTRAVENOUS

## 2017-03-03 MED ORDER — TRAMADOL HCL 50 MG PO TABS: 50.0000 mg | ORAL_TABLET | Freq: Four times a day (QID) | ORAL | 0 refills | Status: DC | PRN

## 2017-03-03 MED ORDER — OXYCODONE HCL 5 MG PO TABS
5.0000 mg | ORAL_TABLET | Freq: Once | ORAL | Status: DC | PRN
  Filled 2017-03-03: qty 1

## 2017-03-03 MED ORDER — CIPROFLOXACIN IN D5W 400 MG/200ML IV SOLN
INTRAVENOUS | Status: AC
  Filled 2017-03-03: qty 200

## 2017-03-03 MED ORDER — MIDAZOLAM HCL 2 MG/2ML IJ SOLN
INTRAMUSCULAR | Status: AC
  Filled 2017-03-03: qty 2

## 2017-03-03 MED ORDER — ONDANSETRON HCL 4 MG/2ML IJ SOLN
4.0000 mg | Freq: Once | INTRAMUSCULAR | Status: DC | PRN
  Filled 2017-03-03: qty 2

## 2017-03-03 MED ORDER — FENTANYL CITRATE (PF) 100 MCG/2ML IJ SOLN
INTRAMUSCULAR | Status: AC
  Filled 2017-03-03: qty 2

## 2017-03-03 MED ORDER — METOCLOPRAMIDE HCL 5 MG/ML IJ SOLN
INTRAMUSCULAR | Status: AC
  Filled 2017-03-03: qty 2

## 2017-03-03 MED ORDER — LIDOCAINE 2% (20 MG/ML) 5 ML SYRINGE
INTRAMUSCULAR | Status: AC
  Filled 2017-03-03: qty 5

## 2017-03-03 MED ORDER — PROPOFOL 10 MG/ML IV BOLUS
INTRAVENOUS | Status: DC | PRN
Start: 2017-03-03 — End: 2017-03-03
  Administered 2017-03-03 (×2): 50 mg via INTRAVENOUS
  Administered 2017-03-03: 150 mg via INTRAVENOUS

## 2017-03-03 MED ORDER — DEXAMETHASONE SODIUM PHOSPHATE 10 MG/ML IJ SOLN
INTRAMUSCULAR | Status: AC
  Filled 2017-03-03: qty 1

## 2017-03-03 MED ORDER — DEXAMETHASONE SODIUM PHOSPHATE 4 MG/ML IJ SOLN
INTRAMUSCULAR | Status: DC | PRN
  Administered 2017-03-03: 10 mg via INTRAVENOUS

## 2017-03-03 MED ORDER — METOCLOPRAMIDE HCL 5 MG/ML IJ SOLN
INTRAMUSCULAR | Status: DC | PRN
  Administered 2017-03-03: 10 mg via INTRAVENOUS

## 2017-03-03 MED ORDER — BELLADONNA ALKALOIDS-OPIUM 16.2-60 MG RE SUPP
RECTAL | Status: DC | PRN
  Administered 2017-03-03: 1 via RECTAL

## 2017-03-03 MED ORDER — TRAMADOL HCL 50 MG PO TABS
ORAL_TABLET | ORAL | Status: AC
  Filled 2017-03-03: qty 1

## 2017-03-03 MED ORDER — PROPOFOL 10 MG/ML IV BOLUS
INTRAVENOUS | Status: AC
  Filled 2017-03-03: qty 20

## 2017-03-03 MED ORDER — TRAMADOL HCL 50 MG PO TABS
50.0000 mg | ORAL_TABLET | Freq: Once | ORAL | Status: AC
  Administered 2017-03-03: 50 mg via ORAL
  Filled 2017-03-03: qty 1

## 2017-03-03 MED ORDER — BELLADONNA ALKALOIDS-OPIUM 16.2-60 MG RE SUPP
RECTAL | Status: AC
  Filled 2017-03-03: qty 1

## 2017-03-03 MED ORDER — IOHEXOL 300 MG/ML  SOLN
INTRAMUSCULAR | Status: DC | PRN
  Administered 2017-03-03: 10 mL

## 2017-03-03 MED ORDER — FENTANYL CITRATE (PF) 100 MCG/2ML IJ SOLN
INTRAMUSCULAR | Status: DC | PRN
  Administered 2017-03-03: 50 ug via INTRAVENOUS
  Administered 2017-03-03 (×3): 25 ug via INTRAVENOUS
  Administered 2017-03-03: 50 ug via INTRAVENOUS
  Administered 2017-03-03: 25 ug via INTRAVENOUS

## 2017-03-03 MED ORDER — FENTANYL CITRATE (PF) 100 MCG/2ML IJ SOLN
25.0000 ug | INTRAMUSCULAR | Status: DC | PRN
  Filled 2017-03-03: qty 1

## 2017-03-03 MED ORDER — ONDANSETRON HCL 4 MG/2ML IJ SOLN
INTRAMUSCULAR | Status: AC
  Filled 2017-03-03: qty 2

## 2017-03-03 SURGICAL SUPPLY — 33 items
BAG DRAIN URO-CYSTO SKYTR STRL (DRAIN) ×3 IMPLANT
BASKET DAKOTA 1.9FR 11X120 (BASKET) IMPLANT
BASKET LASER NITINOL 1.9FR (BASKET) IMPLANT
BASKET STNLS GEMINI 4WIRE 3FR (BASKET) IMPLANT
BASKET STONE 1.7 NGAGE (UROLOGICAL SUPPLIES) ×3 IMPLANT
BASKET STONE NCOMPASS (UROLOGICAL SUPPLIES) ×3 IMPLANT
BASKET ZERO TIP NITINOL 2.4FR (BASKET) IMPLANT
CATH URET 5FR 28IN OPEN ENDED (CATHETERS) ×3 IMPLANT
CATH URET DUAL LUMEN 6-10FR 50 (CATHETERS) ×3 IMPLANT
CLOTH BEACON ORANGE TIMEOUT ST (SAFETY) ×3 IMPLANT
FIBER LASER FLEXIVA 200 (UROLOGICAL SUPPLIES) ×3 IMPLANT
FIBER LASER TRAC TIP (UROLOGICAL SUPPLIES) IMPLANT
GLOVE BIO SURGEON STRL SZ7.5 (GLOVE) ×3 IMPLANT
GOWN STRL REUS W/ TWL XL LVL3 (GOWN DISPOSABLE) ×2 IMPLANT
GOWN STRL REUS W/TWL XL LVL3 (GOWN DISPOSABLE) ×4
GUIDEWIRE 0.038 PTFE COATED (WIRE) IMPLANT
GUIDEWIRE ANG ZIPWIRE 038X150 (WIRE) ×3 IMPLANT
GUIDEWIRE STR DUAL SENSOR (WIRE) ×6 IMPLANT
IV NS IRRIG 3000ML ARTHROMATIC (IV SOLUTION) ×6 IMPLANT
KIT BALLIN UROMAX 15FX10 (LABEL) IMPLANT
KIT BALLN UROMAX 15FX4 (MISCELLANEOUS) IMPLANT
KIT BALLN UROMAX 26 75X4 (MISCELLANEOUS)
KIT RM TURNOVER CYSTO AR (KITS) ×3 IMPLANT
MANIFOLD NEPTUNE II (INSTRUMENTS) ×3 IMPLANT
NS IRRIG 500ML POUR BTL (IV SOLUTION) ×3 IMPLANT
PACK CYSTO (CUSTOM PROCEDURE TRAY) ×3 IMPLANT
SET HIGH PRES BAL DIL (LABEL)
SHEATH ACCESS URETERAL 24CM (SHEATH) ×3 IMPLANT
SHEATH ACCESS URETERAL 38CM (SHEATH) IMPLANT
STENT URET 6FRX24 CONTOUR (STENTS) ×3 IMPLANT
TUBE CONNECTING 12'X1/4 (SUCTIONS) ×1
TUBE CONNECTING 12X1/4 (SUCTIONS) ×2 IMPLANT
TUBE FEEDING 8FR 16IN STR KANG (MISCELLANEOUS) ×3 IMPLANT

## 2017-03-03 NOTE — Interval H&P Note (Signed)
History and Physical Interval Note: The patient presents today for definitive management of her left ureteral stones. We discussed treatment options following stent placement previously, and the patient ultimately decided to proceed with left-sided ureteroscopy, laser lithotripsy, stent exchange. I went over the risks and benefits of the procedure with the patient's significant detail. Having answered all the patient and her family's questions, she is up to proceed.  There no changes to the previous history of physical. Okay to proceed with surgery.  NAD RRR CTA-B   03/03/2017 1:51 PM  Maria Chavez  has presented today for surgery, with the diagnosis of LEFT OBSTRUCTING URETERAL STONE  The various methods of treatment have been discussed with the patient and family. After consideration of risks, benefits and other options for treatment, the patient has consented to  Procedure(s): CYSTOSCOPY/URETEROSCOPY/HOLMIUM LASER/STENT PLACEMENT (Left) as a surgical intervention .  The patient's history has been reviewed, patient examined, no change in status, stable for surgery.  I have reviewed the patient's chart and labs.  Questions were answered to the patient's satisfaction.     Louis Meckel W

## 2017-03-03 NOTE — Op Note (Signed)
Preoperative diagnosis:  1. Left ureteral stones   Postoperative diagnosis:  1. Left ureteral stones   Procedure: 1. Cystoscopy, left retrograde pyelogram with interpretation 2. Left ureteral stent exchange 3. Left ureteroscopy, laser lithotripsy, stone extraction  Surgeon: Ardis Hughs, MD  Anesthesia: General  Complications: None  Intraoperative findings:  1: The patient's retrograde pyelogram was normal demonstrating a normal caliber ureter with a filling defect in the lower pole of the left kidney. There are no other filling defects or hydronephrosis noted. #2: The patient's stone was located in the left lower pole which was moved into the upper pole using a basket prior to her fragmentation. The stone was dusted on the thin shell and then the artery core of the stone was fragmented into numerous pieces that I was able to remove. #3  a 24 cm times 6 French double-J ureteral stent was exchanged from the left ureter. This. The stent tether was tucked to the patient's vagina.  EBL: Minimal  Specimens: Stone fragments were taken to [facility] for further stone composition analysis.  Indication: Maria Chavez is a 51 y.o. patient with history of a left obstructing proximal ureteral stone with infection. She underwent a stent several weeks prior was placed on antibiotics. She then re-presented to the hospital with worsening pyelonephritis and her antibiotics were changed to broad spectrum. Upon convalescence she was returned or discharged home and today returns for more definitive management..  After reviewing the management options for treatment, he elected to proceed with the above surgical procedure(s). We have discussed the potential benefits and risks of the procedure, side effects of the proposed treatment, the likelihood of the patient achieving the goals of the procedure, and any potential problems that might occur during the procedure or recuperation. Informed consent  has been obtained.  Description of procedure:  The patient was taken to the operating room and general anesthesia was induced.  The patient was placed in the dorsal lithotomy position, prepped and draped in the usual sterile fashion, and preoperative antibiotics were administered. A preoperative time-out was performed.   A 21 French 30 cystoscope was gently passed to the patient's reason and the bladder under visual guidance. Cystoscopy is then performed noting no significant or unusual findings including no tumors, stones, or bladder abnormality. There was some edema around the patient's left ureteral orifice. The stent was noted to be emanating from that with a nice curl. I then grasped the tip of the stent and pulled it to the urethral meatus using stent graspers. I then advanced a 0.038 sensor wire through the stent and into the left renal pelvis remove the stent over the wire. I then advanced a 6/4 [place] semirigid ureteroscope into the bladder and easily cannulated the left ureteral orifice advancing the scope into the left ureter all the way to the proximal ureter noting no significant abnormality. I then advanced a second wire through the ureteroscope backing the scope out over the wire. I then used a small ureteral access sheath 12/14 [place] the area with good effect over the second wire and into the proximal ureter. The second wire and inner portion of the access sheath were removed. I then advanced flexible ureteroscope into the patient's ureter through the access sheath and into the left renal pelvis. Pyeloscopy ensued and a large stone was noted in the left lower pole as well as a smaller stone. This more stone was quickly grasped with the engage basket and removed. The larger stone was grasped with  some difficulty and advanced into the upper pole where the basket disengaged. I then used a 200  fiber with settings of point 2 J and 30 Hz to close the outer sheath soft shell of the stone.  Once the core of the stone changed to settings the 1 J and 10 Hz and fragmented the stone into several smaller pieces. I then was able to remove the large fragments with the engage basket. I then exchanged the basket for an encompass basket so than get any additional smaller fragments. I then looked around for any large remaining fragments with with any other calyces of the kidney noting no significant stone fragments remaining. There were several areas of stone dust/debris which I attempted to aspirate with a 10 mL syringe. Most of the stone dust was easily evacuated. There were some segments that were too large to remove but the smallest to likely pass on around which were located near pole.  This point I slowly backed the scope out of the left ureter gag and the basket behind ensuring that any small remaining fragments were basketed. The left ureter was noted to be clean. I then backloaded the wire into the cystoscope and reintroduced the cystoscope into the patient's bladder. I then exchanged the wire for a 5 [place] over a year ureteral catheter and again injected contrast into the left collecting system. I then replaced the wire and advanced a 6 [place] times 24 cm double-J stent over the wire and into the left ureter using the Seldinger technique. The stent was noted to curl in the upper pole of the left kidney. I then backed the scope out. A bladder neck and advanced the stent until the distal aspect was noted removing the wire completely so that a nice curl was noted in the bladder. The bladder was subsequently emptied. Stent tether was then curled up and tucked into the patient's vagina.  I then placed a B and O suppository into the patient's rectum. I injected lidocaine jelly into the patient's urethra. She was subsequently extubated and returned to the PACU in stable condition.  Ardis Hughs, M.D.

## 2017-03-03 NOTE — H&P (View-Only) (Signed)
Urology Inpatient Progress Report  Hypokalemia [E87.6] Kidney stone [N20.0] Pyelonephritis [N12] Hematuria [R31.9] Acute pyelonephritis [N10]     Intv/Subj: No acute events overnight. Patient is  Complaining of left flank pain and overall malaise.   Principal Problem:   Acute pyelonephritis Active Problems:   Nausea & vomiting   Hypokalemia   Pyelonephritis  Current Facility-Administered Medications  Medication Dose Route Frequency Provider Last Rate Last Dose  . acetaminophen (TYLENOL) tablet 650 mg  650 mg Oral Q6H PRN Reyne Dumas, MD   650 mg at 02/19/17 1828   Or  . acetaminophen (TYLENOL) suppository 650 mg  650 mg Rectal Q6H PRN Reyne Dumas, MD      . enoxaparin (LOVENOX) injection 40 mg  40 mg Subcutaneous Q24H Reyne Dumas, MD   40 mg at 02/22/17 1243  . levalbuterol (XOPENEX) nebulizer solution 0.63 mg  0.63 mg Nebulization Q6H PRN Reyne Dumas, MD      . morphine 4 MG/ML injection 1 mg  1 mg Intravenous Q4H PRN Nita Sells, MD   1 mg at 02/20/17 1502  . ondansetron (ZOFRAN) tablet 4 mg  4 mg Oral Q6H PRN Reyne Dumas, MD       Or  . ondansetron (ZOFRAN) injection 4 mg  4 mg Intravenous Q6H PRN Reyne Dumas, MD   4 mg at 02/22/17 1803  . oxybutynin (DITROPAN) tablet 5 mg  5 mg Oral Q8H PRN Burnice Logan, MD   5 mg at 02/19/17 1301  . oxyCODONE (Oxy IR/ROXICODONE) immediate release tablet 5-10 mg  5-10 mg Oral Q3H PRN Nita Sells, MD   10 mg at 02/23/17 0813  . sulfamethoxazole-trimethoprim (BACTRIM DS,SEPTRA DS) 800-160 MG per tablet 1 tablet  1 tablet Oral Q12H Nita Sells, MD   1 tablet at 02/22/17 2236  . tamsulosin (FLOMAX) capsule 0.4 mg  0.4 mg Oral Daily Reyne Dumas, MD   0.4 mg at 02/22/17 0933  . tamsulosin (FLOMAX) capsule 0.4 mg  0.4 mg Oral Daily PRN Burnice Logan, MD   0.4 mg at 02/20/17 0127  . zolpidem (AMBIEN) tablet 5 mg  5 mg Oral QHS Nita Sells, MD   5 mg at 02/22/17 2347     Objective: Vital: Vitals:   02/22/17 0549 02/22/17 1300 02/22/17 2221 02/23/17 0435  BP: 109/64 113/74 110/71 103/68  Pulse: 86 96 99 (!) 101  Resp: 18 18 18 20   Temp: 98.8 F (37.1 C) 98.1 F (36.7 C) 99.5 F (37.5 C) 98.7 F (37.1 C)  TempSrc: Oral Oral Oral Oral  SpO2: 97% 100% 97% 97%  Weight:      Height:       I/Os: I/O last 3 completed shifts: In: 850 [P.O.:800; IV Piggyback:50] Out: 2951 [Urine:2951]  Physical Exam:  General: Patient is in no apparent distress Lungs: Normal respiratory effort, chest expands symmetrically. The patient has significant left-sided flank tenderness Ext: lower extremities symmetric  Lab Results:  Recent Labs  02/21/17 0516 02/22/17 0451 02/23/17 0532  WBC 17.3* 16.2* 14.5*  HGB 10.4* 10.9* 11.2*  HCT 30.3* 32.2* 33.2*    Recent Labs  02/21/17 0516 02/22/17 0451 02/23/17 0532  NA 130* 131* 131*  K 4.0 4.0 3.5  CL 98* 98* 97*  CO2 24 22 24   GLUCOSE 104* 101* 115*  BUN 5* 7 10  CREATININE 0.80 0.81 0.97  CALCIUM 8.6* 9.1 9.4   No results for input(s): LABPT, INR in the last 72 hours. No results for input(s): LABURIN in the last  72 hours. Results for orders placed or performed during the hospital encounter of 02/18/17  Urine culture     Status: Abnormal   Collection Time: 02/18/17 10:17 PM  Result Value Ref Range Status   Specimen Description URINE, CLEAN CATCH  Final   Special Requests NONE  Final   Culture (A)  Final    <10,000 COLONIES/mL INSIGNIFICANT GROWTH Performed at [facility], [address], [place], [place] [address]    Report Status 02/20/2017 FINAL  Final  Culture, blood (routine x 2)     Status: None (Preliminary result)   Collection Time: 02/19/17 10:40 AM  Result Value Ref Range Status   Specimen Description BLOOD LEFT ARM  Final   Special Requests BOTTLES DRAWN AEROBIC AND ANAEROBIC 3CC  Final   Culture   Final    NO GROWTH 3 DAYS Performed at [facility], [place] [address], [place], [place] [address]    Report  Status PENDING  Incomplete  Culture, blood (routine x 2)     Status: None (Preliminary result)   Collection Time: 02/19/17 10:55 AM  Result Value Ref Range Status   Specimen Description BLOOD RIGHT ARM  Final   Special Requests BOTTLES DRAWN AEROBIC AND ANAEROBIC 3CC  Final   Culture   Final    NO GROWTH 3 DAYS Performed at [facility], [place] [address] [place], [place] [address]    Report Status PENDING  Incomplete    Studies/Results: No results found.  Assessment/Plan: The patient is still recovering from left-sided pyelonephritis.  At this point, she needs to convalesce more prior to proceeding to the operating room.  As such, I would recommend that the patient be discharged home with a total of 2 weeks of Bactrim, we will get her scheduled for definitive stone treatment within the next 7-14 days.     Louis Meckel, MD Urology 02/23/2017, 8:43 AM

## 2017-03-03 NOTE — Anesthesia Procedure Notes (Signed)
Procedure Name: LMA Insertion Date/Time: 03/03/2017 2:07 PM Performed by: Justice Rocher Pre-anesthesia Checklist: Patient identified, Emergency Drugs available, Suction available and Patient being monitored Patient Re-evaluated:Patient Re-evaluated prior to inductionOxygen Delivery Method: Circle system utilized Preoxygenation: Pre-oxygenation with 100% oxygen Intubation Type: IV induction Ventilation: Mask ventilation without difficulty LMA: LMA inserted LMA Size: 4.0 Number of attempts: 1 Airway Equipment and Method: Bite block Placement Confirmation: positive ETCO2 and breath sounds checked- equal and bilateral Tube secured with: Tape Dental Injury: Teeth and Oropharynx as per pre-operative assessment

## 2017-03-03 NOTE — Transfer of Care (Signed)
Immediate Anesthesia Transfer of Care Note  Patient: Maria Chavez  Procedure(s) Performed: Procedure(s) (LRB): CYSTOSCOPY/URETEROSCOPY/HOLMIUM LASER/ STONE BASKETRY, STENT PLACEMENT, RETROGRADE PYLOGRAM (Left)  Patient Location: PACU  Anesthesia Type: General  Level of Consciousness: awake, sedated, patient cooperative and responds to stimulation  Airway & Oxygen Therapy: Patient Spontanous Breathing and Patient connected to  O2  Post-op Assessment: Report given to PACU RN, Post -op Vital signs reviewed and stable and Patient moving all extremities  Post vital signs: Reviewed and stable  Complications: No apparent anesthesia complications

## 2017-03-03 NOTE — Anesthesia Preprocedure Evaluation (Addendum)
Anesthesia Evaluation  Patient identified by MRN, date of birth, ID band Patient awake    Reviewed: Allergy & Precautions, NPO status , Patient's Chart, lab work & pertinent test results  Airway Mallampati: II  TM Distance: >3 FB Neck ROM: Full    Dental  (+) Teeth Intact, Dental Advisory Given   Pulmonary former smoker,    breath sounds clear to auscultation       Cardiovascular  Rhythm:Regular Rate:Normal     Neuro/Psych    GI/Hepatic   Endo/Other    Renal/GU      Musculoskeletal   Abdominal   Peds  Hematology   Anesthesia Other Findings   Reproductive/Obstetrics                            Anesthesia Physical Anesthesia Plan  ASA: III  Anesthesia Plan: General   Post-op Pain Management:    Induction: Intravenous  Airway Management Planned: LMA  Additional Equipment:   Intra-op Plan:   Post-operative Plan:   Informed Consent: I have reviewed the patients History and Physical, chart, labs and discussed the procedure including the risks, benefits and alternatives for the proposed anesthesia with the patient or authorized representative who has indicated his/her understanding and acceptance.   Dental advisory given  Plan Discussed with: CRNA and Anesthesiologist  Anesthesia Plan Comments: (51 year old female with chronic Crohn's disease admitted 02/12/17 with L. ureteral stone and UTI. Underwent stent placement on 2/24. Found to have LBBB of unknown duration. Troponins negative, EF normal on echo. Outpatient stress test recommended per cardiology and has not been done. She denies cardiac symptoms. Now she arrives for cysto ureteroscopy and stent placement today. Will proceed with planned procedure today as it is low risk and patient has no evidence of acute cardiac condition. Plan GA with LMA  Roberts Gaudy)     Anesthesia Quick Evaluation

## 2017-03-03 NOTE — Discharge Instructions (Signed)
°  Post Anesthesia Home Care Instructions  Activity: Get plenty of rest for the remainder of the day. A responsible adult should stay with you for 24 hours following the procedure.  For the next 24 hours, DO NOT: -Drive a car -Paediatric nurse -Drink alcoholic beverages -Take any medication unless instructed by your physician -Make any legal decisions or sign important papers.  Meals: Start with liquid foods such as gelatin or soup. Progress to regular foods as tolerated. Avoid greasy, spicy, heavy foods. If nausea and/or vomiting occur, drink only clear liquids until the nausea and/or vomiting subsides. Call your physician if vomiting continues.  Special Instructions/Symptoms: Your throat may feel dry or sore from the anesthesia or the breathing tube placed in your throat during surgery. If this causes discomfort, gargle with warm salt water. The discomfort should disappear within 24 hours.  If you had a scopolamine patch placed behind your ear for the management of post- operative nausea and/or vomiting:  1. The medication in the patch is effective for 72 hours, after which it should be removed.  Wrap patch in a tissue and discard in the trash. Wash hands thoroughly with soap and water. 2. You may remove the patch earlier than 72 hours if you experience unpleasant side effects which may include dry mouth, dizziness or visual disturbances. 3. Avoid touching the patch. Wash your hands with soap and water after contact with the patch.   DISCHARGE INSTRUCTIONS FOR KIDNEY STONE/URETERAL STENT   MEDICATIONS:  1.  Resume all your other meds from home - except do not take any extra narcotic pain meds that you may have at home.  2. Pyridium is to help with the burning/stinging when you urinate. 3. Tramadol is for moderate/severe pain, otherwise taking upto 1000 mg every 6 hours of plainTylenol will help treat your pain.     ACTIVITY:  1. No strenuous activity x 1week  2. No driving while on  narcotic pain medications  3. Drink plenty of water  4. Continue to walk at home - you can still get blood clots when you are at home, so keep active, but don't over do it.  5. May return to work/school tomorrow or when you feel ready   BATHING:  1. You can shower and we recommend daily showers  2. You have a string coming from your urethra: The stent string is attached to your ureteral stent. Do not pull on this.   SIGNS/SYMPTOMS TO CALL:  Please call us if you have a fever greater than 101.5, uncontrolled nausea/vomiting, uncontrolled pain, dizziness, unable to urinate, bloody urine, chest pain, shortness of breath, leg swelling, leg pain, redness around wound, drainage from wound, or any other concerns or questions.   You can reach [place] at [phone].   FOLLOW-UP:  1. You have an appointment in 6 weeks with a ultrasound of your kidneys prior.  2. You have a string attached to your stent, you may remove it on March 19th. To do this, pull the strings until the stents are completely removed. You may feel an odd sensation in your back.

## 2017-03-04 ENCOUNTER — Encounter (HOSPITAL_BASED_OUTPATIENT_CLINIC_OR_DEPARTMENT_OTHER): Payer: Self-pay | Admitting: Urology

## 2017-03-04 NOTE — Anesthesia Postprocedure Evaluation (Addendum)
Anesthesia Post Note  Patient: Maria Chavez  Procedure(s) Performed: Procedure(s) (LRB): CYSTOSCOPY/URETEROSCOPY/HOLMIUM LASER/ STONE BASKETRY, STENT PLACEMENT, RETROGRADE PYLOGRAM (Left)  Patient location during evaluation: PACU Anesthesia Type: General Level of consciousness: awake and alert Pain management: pain level controlled Vital Signs Assessment: post-procedure vital signs reviewed and stable Respiratory status: spontaneous breathing, nonlabored ventilation, respiratory function stable and patient connected to nasal cannula oxygen Cardiovascular status: blood pressure returned to baseline and stable Postop Assessment: no signs of nausea or vomiting Anesthetic complications: no       Last Vitals:  Vitals:   03/03/17 1600 03/03/17 1630  BP: 125/75 116/62  Pulse: 81 72  Resp: 12 12  Temp:  36.7 C    Last Pain:  Vitals:   03/04/17 1323  TempSrc:   PainSc: 4                  Orman Matsumura S

## 2017-03-08 ENCOUNTER — Ambulatory Visit (INDEPENDENT_AMBULATORY_CARE_PROVIDER_SITE_OTHER): Payer: Self-pay | Admitting: Physician Assistant

## 2017-03-08 ENCOUNTER — Encounter: Payer: Self-pay | Admitting: Physician Assistant

## 2017-03-08 ENCOUNTER — Encounter (INDEPENDENT_AMBULATORY_CARE_PROVIDER_SITE_OTHER): Payer: Self-pay

## 2017-03-08 VITALS — BP 109/80 | HR 80 | Ht 63.0 in | Wt 125.0 lb

## 2017-03-08 DIAGNOSIS — E7849 Other hyperlipidemia: Secondary | ICD-10-CM

## 2017-03-08 DIAGNOSIS — Z720 Tobacco use: Secondary | ICD-10-CM | POA: Insufficient documentation

## 2017-03-08 DIAGNOSIS — Z8249 Family history of ischemic heart disease and other diseases of the circulatory system: Secondary | ICD-10-CM

## 2017-03-08 DIAGNOSIS — E784 Other hyperlipidemia: Secondary | ICD-10-CM

## 2017-03-08 DIAGNOSIS — I10 Essential (primary) hypertension: Secondary | ICD-10-CM | POA: Insufficient documentation

## 2017-03-08 DIAGNOSIS — E785 Hyperlipidemia, unspecified: Secondary | ICD-10-CM | POA: Insufficient documentation

## 2017-03-08 DIAGNOSIS — I447 Left bundle-branch block, unspecified: Secondary | ICD-10-CM

## 2017-03-08 NOTE — Progress Notes (Signed)
Cardiology Office Note    Date:  03/08/2017   ID:  Meryl Dare, DOB [birth year], MRN [id number]  PCP:  No PCP Per Patient  Cardiologist: Dr. Harl Bowie    Chief Complaint  Patient presents with  . Follow-up    History of Present Illness:  Hollie Wojahn is a 51 y.o. female with recent sepsis secondary to  left pyelonephritis s/p left ureteral stent placement 02/13/17 for left obstructing ureteral calculus in setting of Klebsiella UTI. Patient was likely discharged on inadequate antibiotic treatment 02/23/17. Also had hypokalemia.   Patient was seen in the hospital by Dr. Harl Bowie 02/14/17 preprocedure because of a left bundle branch block of unknown chronicity. No prior EKG to compare to. She denied any chest pain or shortness of breath. She does have history of hypertension but now not on meds, HLD, and is a smoker-trying to quit. Troponins were low and flat at 0.04, 0.032 in the setting of tachycardia and AKI.Marland Kitchen 2-D echo showed LVEF 55% no wall motion abnormalities, grade 1 DD, probable PFO. Because of her CAD risk factors and LBBB Lexi scan MPI was recommended as an outpatient.  Patient comes in today for follow-up. She denies any chest pain. She is trying to quit smoking. She is down to 4 cigarettes since she was discharged from the hospital. She sometimes feels like her heart racing but not very frequently. Sometimes she feels like she has to take a deep breath but attributes that to smoking. She denies dyspnea, dizziness or presyncope.   Past Medical History:  Diagnosis Date  . Crohn's disease ([place])   . History of acute pyelonephritis    02-18-2017  w/ Klebsiella UTI  . History of kidney stones   . Left bundle branch block (LBBB)    found at hospital admission 02-12-2017-- cardiologist consult (in hospital), stated low suspicion MI and follow-up outpatient  . Left flank pain   . Left ureteral stone   . Wears glasses     Past Surgical History:  Procedure Laterality Date  .  COLON RESECTION  1999   w/ Cholecystectomy   (crohn's diesease)  . CYSTOSCOPY W/ URETERAL STENT PLACEMENT Left 02/13/2017   Procedure: CYSTOSCOPY WITH LEFT RETROGRADE PYELOGRAM/LEFT URETERAL STENT PLACEMENT;  Surgeon: Ardis Hughs, MD;  Location: [facility];  Service: Urology;  Laterality: Left;  . CYSTOSCOPY/URETEROSCOPY/HOLMIUM LASER/STENT PLACEMENT Left 03/03/2017   Procedure: CYSTOSCOPY/URETEROSCOPY/HOLMIUM LASER/ STONE BASKETRY, STENT PLACEMENT, RETROGRADE PYLOGRAM;  Surgeon: Ardis Hughs, MD;  Location: [facility];  Service: Urology;  Laterality: Left;  . EXTRACORPOREAL SHOCK WAVE LITHOTRIPSY  2013  . TRANSTHORACIC ECHOCARDIOGRAM  02/13/2017   grade 1 diastolic dysfunction,  ef 55%/  trivial AR, MR, & TR/ mild LAE/  there was a probable patent foramen ovale/ ventricular septum abnormal function & dyssynergy consistent w/ LBBB/    . TUBAL LIGATION Bilateral 1992    Current Medications: Outpatient Medications Prior to Visit  Medication Sig Dispense Refill  . sulfamethoxazole-trimethoprim (BACTRIM DS,SEPTRA DS) 800-160 MG tablet Take 1 tablet by mouth every 12 (twelve) hours. 28 tablet 0  . phenazopyridine (PYRIDIUM) 200 MG tablet Take 1 tablet (200 mg total) by mouth 3 (three) times daily as needed for pain. 10 tablet 0  . promethazine (PHENERGAN) 12.5 MG tablet Take 1 tablet (12.5 mg total) by mouth every 6 (six) hours as needed for nausea or vomiting. 30 tablet 0  . traMADol (ULTRAM) 50 MG tablet Take 1-2 tablets (50-100 mg total) by mouth every 6 (six) hours as  needed for moderate pain. 30 tablet 0   No facility-administered medications prior to visit.      Allergies:   Patient has no known allergies.   Social History   Social History  . Marital status: Legally Separated    Spouse name: N/A  . Number of children: N/A  . Years of education: N/A   Social History Main Topics  . Smoking status: Former Smoker    Packs/day: 1.25    Years: 35.00    Types:  Cigarettes    Quit date: 02/11/2017  . Smokeless tobacco: Never Used     Comment: has not smoke since she was in the hospital  . Alcohol use No  . Drug use: No  . Sexual activity: Not Asked   Other Topics Concern  . None   Social History Narrative  . None     Family History:  The patient's   family history includes Heart disease in her father; Leukemia in her father.   ROS:   Please see the history of present illness.    Review of Systems  Cardiovascular: Positive for palpitations.  Respiratory: Positive for snoring.   Musculoskeletal: Positive for back pain.  Gastrointestinal: Positive for abdominal pain, diarrhea, nausea and vomiting.       Chron's disease  Neurological: Positive for headaches.   All other systems reviewed and are negative.   PHYSICAL EXAM:   VS:  BP 109/80 Comment: left  Pulse 80   Ht 5\' 3"  (1.6 m)   Wt 125 lb (56.7 kg)   BMI 22.14 kg/m   Physical Exam  GEN: Well nourished, well developed, in no acute distress  Neck: no JVD, carotid bruits, or masses Cardiac:RRR; no murmurs, rubs, or gallops  Respiratory:  clear to auscultation bilaterally, normal work of breathing GI: soft, nontender, nondistended, + BS Ext: without cyanosis, clubbing, or edema, Good distal pulses bilaterally Psych: euthymic mood, full affect  Wt Readings from Last 3 Encounters:  03/08/17 125 lb (56.7 kg)  03/03/17 124 lb (56.2 kg)  02/19/17 135 lb 9.3 oz (61.5 kg)      Studies/Labs Reviewed:   EKG:  EKG is ordered today.  The ekg ordered today demonstrates Normal Sinus rhythm with left bundle branch block  Recent Labs: 02/19/2017: Magnesium 2.2 02/21/2017: ALT 50 02/23/2017: BUN 10; Creatinine, Ser 0.97; Hemoglobin 11.2; Platelets 439; Potassium 3.5; Sodium 131   Lipid Panel No results found for: CHOL, TRIG, HDL, CHOLHDL, VLDL, LDLCALC, LDLDIRECT  Additional studies/ records that were reviewed today include:  2-D echo 02/13/17  Study Conclusions   - Left ventricle: The  cavity size was normal. There was moderate   concentric hypertrophy. The estimated ejection fraction was 55%.   Wall motion was normal; there were no regional wall motion   abnormalities. Doppler parameters are consistent with abnormal   left ventricular relaxation (grade 1 diastolic dysfunction). - Ventricular septum: Septal motion showed abnormal function and   dyssynergy. These changes are consistent with a left bundle   branch block. - Aortic valve: There was trivial regurgitation. - Left atrium: The atrium was mildly dilated. - Atrial septum: There was a probable patent foramen ovale.     ASSESSMENT:    1. LBBB (left bundle branch block)   2. Essential hypertension   3. Other hyperlipidemia   4. Family history of coronary artery disease   5. Tobacco abuse      PLAN:  In order of problems listed above:  Left bundle branch block question  chronic needs evaluation with Lexi scan Myoview. Patient has the paperwork to fill out for [facility] assistance since she is self-pay. She will schedule a stress test as soon as she turns in the paperwork. Will schedule lexiscan at [facility] and have her f/u with Dr. Harl Bowie after.   Hypertension patient has a remote history of hypertension in the setting of kidney stones. She does not take any medication and her blood pressure is normal today  HLD untreated  Family history of CAD with father having CABG  Tobacco abuse smoking cessation recommended. She is wearing the nicotine patch and trying.    Medication Adjustments/Labs and Tests Ordered: Current medicines are reviewed at length with the patient today.  Concerns regarding medicines are outlined above.  Medication changes, Labs and Tests ordered today are listed in the Patient Instructions below. Patient Instructions  Medication Instructions:   Your physician recommends that you continue on your current medications as directed. Please refer to the Current Medication list given to you  today.   If you need a refill on your cardiac medications before your next appointment, please call your pharmacy.  Labwork: NONE ORDERED  TODAY    Testing/Procedures:  Your physician has requested that you have a lexiscan myoview. For further information please visit [email or web address]. Please follow instruction sheet, as given.     Follow-Up:  WITH  DR BRANCH  A MONTH  AFTER TEST    Any Other Special Instructions Will Be Listed Below (If Applicable).                                                                                                                                                      Signed, Ermalinda Barrios, PA-C  03/08/2017 1:05 PM    [facility] [address], [place], [place]  [address] Phone: [phone]; Fax: [phone]

## 2017-03-08 NOTE — Patient Instructions (Signed)
Medication Instructions:   Your physician recommends that you continue on your current medications as directed. Please refer to the Current Medication list given to you today.   If you need a refill on your cardiac medications before your next appointment, please call your pharmacy.  Labwork: NONE ORDERED  TODAY    Testing/Procedures:  Your physician has requested that you have a lexiscan myoview. For further information please visit [email or web address]. Please follow instruction sheet, as given.     Follow-Up:  WITH  DR BRANCH  A MONTH  AFTER TEST    Any Other Special Instructions Will Be Listed Below (If Applicable).

## 2017-03-12 ENCOUNTER — Ambulatory Visit: Payer: Self-pay | Admitting: Internal Medicine

## 2017-03-15 ENCOUNTER — Other Ambulatory Visit: Payer: Self-pay | Admitting: Cardiovascular Disease

## 2017-05-24 NOTE — Addendum Note (Signed)
Addendum  created 05/24/17 1224 by Myrtie Soman, MD   Sign clinical note

## 2020-01-21 ENCOUNTER — Emergency Department (HOSPITAL_COMMUNITY)
Admission: EM | Admit: 2020-01-21 | Discharge: 2020-01-22 | Disposition: A | Discharge status: Home / Self Care | Payer: Self-pay | Attending: Emergency Medicine | Admitting: Emergency Medicine

## 2020-01-21 ENCOUNTER — Other Ambulatory Visit: Payer: Self-pay

## 2020-01-21 ENCOUNTER — Encounter (HOSPITAL_COMMUNITY): Payer: Self-pay | Admitting: Emergency Medicine

## 2020-01-21 ENCOUNTER — Emergency Department (HOSPITAL_COMMUNITY): Payer: Self-pay

## 2020-01-21 DIAGNOSIS — Z79899 Other long term (current) drug therapy: Secondary | ICD-10-CM | POA: Insufficient documentation

## 2020-01-21 DIAGNOSIS — R110 Nausea: Secondary | ICD-10-CM

## 2020-01-21 DIAGNOSIS — R1011 Right upper quadrant pain: Secondary | ICD-10-CM

## 2020-01-21 DIAGNOSIS — E876 Hypokalemia: Secondary | ICD-10-CM

## 2020-01-21 DIAGNOSIS — I10 Essential (primary) hypertension: Secondary | ICD-10-CM | POA: Insufficient documentation

## 2020-01-21 DIAGNOSIS — Z87891 Personal history of nicotine dependence: Secondary | ICD-10-CM | POA: Insufficient documentation

## 2020-01-21 LAB — CBC WITH DIFFERENTIAL/PLATELET
Abs Immature Granulocytes: 0.08 10*3/uL — ABNORMAL HIGH (ref 0.00–0.07)
Basophils Absolute: 0 10*3/uL (ref 0.0–0.1)
Basophils Relative: 0 %
Eosinophils Absolute: 0.2 10*3/uL (ref 0.0–0.5)
Eosinophils Relative: 2 %
HCT: 35.6 % — ABNORMAL LOW (ref 36.0–46.0)
Hemoglobin: 11.5 g/dL — ABNORMAL LOW (ref 12.0–15.0)
Immature Granulocytes: 1 %
Lymphocytes Relative: 27 %
Lymphs Abs: 2.2 10*3/uL (ref 0.7–4.0)
MCH: 28.8 pg (ref 26.0–34.0)
MCHC: 32.3 g/dL (ref 30.0–36.0)
MCV: 89.2 fL (ref 80.0–100.0)
Monocytes Absolute: 0.5 10*3/uL (ref 0.1–1.0)
Monocytes Relative: 6 %
Neutro Abs: 5 10*3/uL (ref 1.7–7.7)
Neutrophils Relative %: 64 %
Platelets: 224 10*3/uL (ref 150–400)
RBC: 3.99 MIL/uL (ref 3.87–5.11)
RDW: 12.9 % (ref 11.5–15.5)
WBC: 7.9 10*3/uL (ref 4.0–10.5)
nRBC: 0 % (ref 0.0–0.2)

## 2020-01-21 LAB — COMPREHENSIVE METABOLIC PANEL
ALT: 13 U/L (ref 0–44)
AST: 14 U/L — ABNORMAL LOW (ref 15–41)
Albumin: 3.1 g/dL — ABNORMAL LOW (ref 3.5–5.0)
Alkaline Phosphatase: 57 U/L (ref 38–126)
Anion gap: 10 (ref 5–15)
BUN: 16 mg/dL (ref 6–20)
CO2: 25 mmol/L (ref 22–32)
Calcium: 8.9 mg/dL (ref 8.9–10.3)
Chloride: 104 mmol/L (ref 98–111)
Creatinine, Ser: 0.79 mg/dL (ref 0.44–1.00)
GFR calc Af Amer: >60 mL/min (ref >60)
GFR calc non Af Amer: >60 mL/min (ref >60)
Glucose, Bld: 95 mg/dL (ref 70–99)
Potassium: 2.8 mmol/L — ABNORMAL LOW (ref 3.5–5.1)
Sodium: 139 mmol/L (ref 135–145)
Total Bilirubin: 0.4 mg/dL (ref 0.3–1.2)
Total Protein: 5.5 g/dL — ABNORMAL LOW (ref 6.5–8.1)

## 2020-01-21 LAB — URINALYSIS, ROUTINE W REFLEX MICROSCOPIC
Bacteria, UA: NONE SEEN
Bilirubin Urine: NEGATIVE
Glucose, UA: NEGATIVE mg/dL
Ketones, ur: NEGATIVE mg/dL
Nitrite: NEGATIVE
Protein, ur: NEGATIVE mg/dL
RBC / HPF: >50 RBC/hpf — ABNORMAL HIGH (ref 0–5)
Specific Gravity, Urine: 1.008 (ref 1.005–1.030)
pH: 7 (ref 5.0–8.0)

## 2020-01-21 LAB — LIPASE, BLOOD: Lipase: 101 U/L — ABNORMAL HIGH (ref 11–51)

## 2020-01-21 MED ORDER — IOHEXOL 9 MG/ML PO SOLN
ORAL | Status: AC
  Filled 2020-01-21: qty 1000

## 2020-01-21 MED ORDER — FENTANYL CITRATE (PF) 100 MCG/2ML IJ SOLN
50.0000 ug | Freq: Once | INTRAMUSCULAR | Status: AC
  Administered 2020-01-21: 50 ug via INTRAVENOUS
  Filled 2020-01-21: qty 2

## 2020-01-21 MED ORDER — IOHEXOL 300 MG/ML  SOLN
100.0000 mL | Freq: Once | INTRAMUSCULAR | Status: AC | PRN
  Administered 2020-01-21: 100 mL via INTRAVENOUS

## 2020-01-21 MED ORDER — ONDANSETRON HCL 4 MG/2ML IJ SOLN
4.0000 mg | Freq: Once | INTRAMUSCULAR | Status: AC
  Administered 2020-01-21: 4 mg via INTRAVENOUS
  Filled 2020-01-21: qty 2

## 2020-01-21 MED ORDER — POTASSIUM CHLORIDE 10 MEQ/100ML IV SOLN
10.0000 meq | Freq: Once | INTRAVENOUS | Status: AC
  Administered 2020-01-21: 10 meq via INTRAVENOUS
  Filled 2020-01-21: qty 100

## 2020-01-21 MED ORDER — MORPHINE SULFATE (PF) 4 MG/ML IV SOLN
4.0000 mg | Freq: Once | INTRAVENOUS | Status: AC
  Administered 2020-01-21: 4 mg via INTRAVENOUS
  Filled 2020-01-21: qty 1

## 2020-01-21 NOTE — ED Provider Notes (Signed)
[facility] EMERGENCY DEPARTMENT Provider Note   CSN: [id number] Arrival date & time: 01/21/20  1613     History Chief Complaint  Patient presents with   Abdominal Pain    Maria Chavez is a 54 y.o. female with a history significant for Crohn's disease significant for partial small bowel and colon resection,  kidney stones and hypertension presenting with right abdominal pain which has been constant, low-grade in intensity but with episodes of sharp stabbing pain intermittently for the past several weeks. She was seen at [facility] an ED in [place] on January 16 at which time a CT scan was completed and she was found to have a Crohn's flare along with nonobstructing bilateral renal stones. She was placed on a 14-day course of prednisone 40 mg which she completed yesterday. She never had complete resolution of her symptoms but rather waxing and waning symptoms. She endorses reduced appetite, occasional mild nausea, no emesis or diarrhea. She also denies fevers.  She is scheduled to establish care with a gastroenterologist in [facility], unfortunately her appointment is not till March.     From [facility] - CT imaging 01/06/20  IMPRESSION:  Nonobstructing bilateral kidney stones. No hydronephrosis is seen.  Colitis likely of inflammatory etiology in light of patient history of Crohns disease. No proximal obstruction is identified.   Signed By: Wynell Balloon, MD, Noman Ahmed   The history is provided by the patient.       Past Medical History:  Diagnosis Date   Crohn's disease ([place])    History of acute pyelonephritis    02-18-2017  w/ Klebsiella UTI   History of kidney stones    Left bundle branch block (LBBB)    found at hospital admission 02-12-2017-- cardiologist consult (in hospital), stated low suspicion MI and follow-up outpatient   Left flank pain    Left ureteral stone    Wears glasses     Patient Active Problem List   Diagnosis Date Noted     LBBB (left bundle branch block) 03/08/2017   Hypertension 03/08/2017   Hyperlipidemia 03/08/2017   Family history of coronary artery disease 03/08/2017   Tobacco abuse 03/08/2017   Pyelonephritis 02/19/2017   Acute pyelonephritis 02/19/2017   Urinary tract obstruction due to kidney stone 02/12/2017   AKI (acute kidney injury) ([place]) 02/12/2017   Acute left flank pain 02/12/2017   Nausea & vomiting 02/12/2017   Hypokalemia 02/12/2017   Stone in kidney 02/12/2017    Past Surgical History:  Procedure Laterality Date   COLON RESECTION  1999   w/ Cholecystectomy   (crohn's diesease)   CYSTOSCOPY W/ URETERAL STENT PLACEMENT Left 02/13/2017   Procedure: CYSTOSCOPY WITH LEFT RETROGRADE PYELOGRAM/LEFT URETERAL STENT PLACEMENT;  Surgeon: Ardis Hughs, MD;  Location: [facility];  Service: Urology;  Laterality: Left;   CYSTOSCOPY/URETEROSCOPY/HOLMIUM LASER/STENT PLACEMENT Left 03/03/2017   Procedure: CYSTOSCOPY/URETEROSCOPY/HOLMIUM LASER/ STONE BASKETRY, STENT PLACEMENT, RETROGRADE PYLOGRAM;  Surgeon: Ardis Hughs, MD;  Location[facility]  Service: Urology;  Laterality: Left;   EXTRACORPOREAL SHOCK WAVE LITHOTRIPSY  2013   TRANSTHORACIC ECHOCARDIOGRAM  02/13/2017   grade 1 diastolic dysfunction,  ef 55%/  trivial AR, MR, & TR/ mild LAE/  there was a probable patent foramen ovale/ ventricular septum abnormal function & dyssynergy consistent w/ LBBB/     TUBAL LIGATION Bilateral 1992     OB History   No obstetric history on file.     Family History  Problem Relation Age of Onset  Heart disease Father    Leukemia Father    Heart attack Neg Hx     Social History   Tobacco Use   Smoking status: Former Smoker    Packs/day: 1.25    Years: 35.00    Pack years: 43.75    Types: Cigarettes    Quit date: 02/11/2017    Years since quitting: 2.9   Smokeless tobacco: Never Used   Tobacco comment: has not smoke since she was in the hospital   Substance Use Topics   Alcohol use: No   Drug use: No    Home Medications Prior to Admission medications   Medication Sig Start Date End Date Taking? Authorizing Provider  ciprofloxacin (CIPRO) 500 MG tablet Take 500 mg by mouth 2 (two) times daily.  01/06/20   [provider]  HYDROcodone-acetaminophen (NORCO/VICODIN) 5-325 MG tablet Take 1 tablet by mouth every 4 (four) hours as needed. 01/22/20   Evalee Jefferson, PA-C  metroNIDAZOLE (FLAGYL) 500 MG tablet Take 500 mg by mouth every 8 (eight) hours. 01/06/20   [provider]  ondansetron (ZOFRAN) 4 MG tablet Take 1 tablet (4 mg total) by mouth every 6 (six) hours. 01/22/20   Evalee Jefferson, PA-C  potassium chloride SA (KLOR-CON) 20 MEQ tablet Take 1 tablet (20 mEq total) by mouth 2 (two) times daily. 01/22/20   Shakila Mak, Almyra Free, PA-C  predniSONE (DELTASONE) 10 MG tablet Take 6 tabs daily by mouth for 2 day,  Then 5 tabs daily for 2 days,  4 tabs daily for 2 days,  3 tabs daily for 2 days,  2 tabs daily for 2 days,  Then 1 tab daily for 2 days. 01/22/20   Evalee Jefferson, PA-C    Allergies    Patient has no known allergies.  Review of Systems   Review of Systems  Constitutional: Negative for chills and fever.  HENT: Negative for congestion and sore throat.   Eyes: Negative.   Respiratory: Negative for chest tightness and shortness of breath.   Cardiovascular: Negative for chest pain.  Gastrointestinal: Positive for abdominal pain and nausea. Negative for constipation, diarrhea and vomiting.  Genitourinary: Negative.   Musculoskeletal: Negative for arthralgias, joint swelling and neck pain.  Skin: Negative.  Negative for rash and wound.  Neurological: Negative for dizziness, weakness, light-headedness, numbness and headaches.  Psychiatric/Behavioral: Negative.     Physical Exam Updated Vital Signs BP 121/82    Pulse 70    Temp 98.5 F (36.9 C) (Oral)    Resp 15    Ht 5\' 3"  (1.6 m)    Wt 59 kg    SpO2 97%    BMI 23.03 kg/m    Physical Exam Vitals and nursing note reviewed.  Constitutional:      Appearance: She is well-developed.  HENT:     Head: Normocephalic and atraumatic.  Eyes:     Conjunctiva/sclera: Conjunctivae normal.  Cardiovascular:     Rate and Rhythm: Normal rate and regular rhythm.     Heart sounds: Normal heart sounds.  Pulmonary:     Effort: Pulmonary effort is normal.     Breath sounds: Normal breath sounds. No wheezing.  Abdominal:     General: A surgical scar is present. Bowel sounds are normal. There is distension.     Palpations: Abdomen is soft.     Tenderness: There is abdominal tenderness in the right upper quadrant. There is no guarding or rebound.  Musculoskeletal:        General: Normal range  of motion.     Cervical back: Normal range of motion.  Skin:    General: Skin is warm and dry.  Neurological:     General: No focal deficit present.     Mental Status: She is alert.     ED Results / Procedures / Treatments   Labs (all labs ordered are listed, but only abnormal results are displayed) Labs Reviewed  COMPREHENSIVE METABOLIC PANEL - Abnormal; Notable for the following components:      Result Value   Potassium 2.8 (*)    Total Protein 5.5 (*)    Albumin 3.1 (*)    AST 14 (*)    All other components within normal limits  LIPASE, BLOOD - Abnormal; Notable for the following components:   Lipase 101 (*)    All other components within normal limits  CBC WITH DIFFERENTIAL/PLATELET - Abnormal; Notable for the following components:   Hemoglobin 11.5 (*)    HCT 35.6 (*)    Abs Immature Granulocytes 0.08 (*)    All other components within normal limits  URINALYSIS, ROUTINE W REFLEX MICROSCOPIC - Abnormal; Notable for the following components:   Hgb urine dipstick LARGE (*)    Leukocytes,Ua TRACE (*)    RBC / HPF >50 (*)    All other components within normal limits    EKG None  Radiology CT ABDOMEN PELVIS W CONTRAST  Result Date: 01/21/2020 CLINICAL DATA:   Right upper quadrant pain, no fever, history of Crohn's EXAM: CT ABDOMEN AND PELVIS WITH CONTRAST TECHNIQUE: Multidetector CT imaging of the abdomen and pelvis was performed using the standard protocol following bolus administration of intravenous contrast. CONTRAST:  161mL OMNIPAQUE IOHEXOL 300 MG/ML  SOLN COMPARISON:  CT renal colic [id number] FINDINGS: Lower chest: Lung bases are clear. Normal heart size. No pericardial effusion. Hepatobiliary: Diffuse hepatic hypoattenuation compatible with hepatic steatosis. No focal liver abnormality is seen. Patient is post cholecystectomy. Slight prominence of the biliary tree likely related to reservoir effect. No calcified intraductal gallstones. Pancreas: Unremarkable. No pancreatic ductal dilatation or surrounding inflammatory changes. Spleen: Normal in size without focal abnormality. Adrenals/Urinary Tract: 1.4 cm intermediate attenuation lesion in the right adrenal gland is unchanged since 2018 and likely reflects a benign adenoma. No concerning adrenal lesions. There is extensive asymmetric atrophy of the left kidney. No concerning renal lesions. Subcentimeter hypertension foci in the kidneys are too small to fully characterize on CT imaging but statistically likely benign. Bilateral nonobstructing calculi in both kidneys, largest in the right renal collecting system measuring up to 8 mm in size. Stomach/Bowel: Distal esophagus, stomach and duodenum are unremarkable. Enteric contrast media was administered. There is passage of contrast to the mid small bowel. Short segmental loops of edematous mural thickening are present throughout the small bowel (2/59) some terminal ileal mural thickening is noted as well (2/63) there is an ileocolic anastomosis. Mild edematous thickening versus peristalsis of the colon (2/17). Distal colonic anastomosis in the left lower quadrant is patent (2/62). Minimal colorectal thickening is suspected as well (2/71). Vascular/Lymphatic:  Atherosclerotic plaque within the normal caliber aorta. Reproductive: Heterogeneous enhancement of the uterus likely reflects some intramural fibroids. No acute or worrisome adnexal lesions. Other: Postsurgical changes of the anterior abdomen. No bowel containing hernias. Small fat containing left inguinal hernia. No free air or fluid within the abdomen or pelvis. Musculoskeletal: No acute osseous abnormality or suspicious osseous lesion. Degenerative changes are present at L5-S1. IMPRESSION: 1. Multiple sites of segmental mural thickening throughout the small bowel are  concerning for potential Crohn's flare. 2. Mild edematous thickening versus peristalsis of the colon may represent additional site of colitis. 3. Postsurgical changes of the anterior abdomen with patent ileocolic and distal colic anastomosis. 4. Bilateral nephrolithiasis.  Left renal atrophy. 5. Hepatic steatosis. 6. Stable right adrenal lesion likely represents a benign adenoma. Electronically Signed   By: Lovena Le M.D.   On: 01/21/2020 22:46    Procedures Procedures (including critical care time)  Medications Ordered in ED Medications  iohexol (OMNIPAQUE) 9 MG/ML oral solution (has no administration in time range)  predniSONE (DELTASONE) tablet 60 mg (has no administration in time range)  morphine 4 MG/ML injection 4 mg (4 mg Intravenous Given 01/21/20 1747)  ondansetron (ZOFRAN) injection 4 mg (4 mg Intravenous Given 01/21/20 1746)  potassium chloride 10 mEq in 100 mL IVPB (0 mEq Intravenous Stopped 01/21/20 2127)  fentaNYL (SUBLIMAZE) injection 50 mcg (50 mcg Intravenous Given 01/21/20 2002)  iohexol (OMNIPAQUE) 300 MG/ML solution 100 mL (100 mLs Intravenous Contrast Given 01/21/20 2226)    ED Course  I have reviewed the triage vital signs and the nursing notes.  Pertinent labs & imaging results that were available during my care of the patient were reviewed by me and considered in my medical decision making (see chart for  details).    MDM Rules/Calculators/A&P                      Pt with known diagnosis of Crohns colitis with persistent flare despite 14 day course of prednisone 40 mg daily,  Labs and Ct imaging reviewed and is c/w probable Crohns flare. No obstruction or abscess, no acute abd findings. Labs stable, no v/d, hematachezia..    Pt not desirous of admission and labs and CT not indicative of need for admission.  No GI specialist on call for [facility] tonight, so call placed to Dr. Henrene Pastor who is on call for unassigned at [facility].  Discussed case but he did not feel comfortable advising tx plan without evaluating pt.  Will refer pt to him as an outpatient.  She was referred to a GI specialist in [place] when seen 2 weeks ago in [place], but appt not until March - she is concerned about waiting this long.  Perhaps can be seen sooner in [place].  In the interim, return precautions discussed including worse pain, fevers, vomiting, diarrhea.     I decided to extend her prednisone (tapering dose), hydrocodone and zofran prescribed.  She has just completed cipro and flagyl.  No indication for further abx at this time.  Final Clinical Impression(s) / ED Diagnoses Final diagnoses:  Right upper quadrant abdominal pain  Nausea  Hypokalemia    Rx / DC Orders ED Discharge Orders         Ordered    HYDROcodone-acetaminophen (NORCO/VICODIN) 5-325 MG tablet  Every 4 hours PRN     01/22/20 0035    ondansetron (ZOFRAN) 4 MG tablet  Every 6 hours     01/22/20 0035    predniSONE (DELTASONE) 10 MG tablet  Status:  Discontinued     01/22/20 0035    predniSONE (DELTASONE) 10 MG tablet     01/22/20 0038    potassium chloride SA (KLOR-CON) 20 MEQ tablet  2 times daily     01/22/20 0044           Evalee Jefferson, PA-C 01/22/20 0053    Ezequiel Essex, MD 01/22/20 [id number]

## 2020-01-21 NOTE — ED Triage Notes (Signed)
Patient complains of abdominal pain that began to get worse two weeks ago. History of Chrohn's and colitis.

## 2020-01-22 MED ORDER — POTASSIUM CHLORIDE CRYS ER 20 MEQ PO TBCR: 20.0000 meq | EXTENDED_RELEASE_TABLET | Freq: Two times a day (BID) | ORAL | 0 refills | Status: AC

## 2020-01-22 MED ORDER — PREDNISONE 10 MG PO TABS: ORAL_TABLET | ORAL | 0 refills | Status: AC

## 2020-01-22 MED ORDER — ONDANSETRON HCL 4 MG PO TABS: 4.0000 mg | ORAL_TABLET | Freq: Four times a day (QID) | ORAL | 0 refills | Status: AC

## 2020-01-22 MED ORDER — PREDNISONE 10 MG PO TABS: ORAL_TABLET | ORAL | 0 refills | Status: DC

## 2020-01-22 MED ORDER — PREDNISONE 50 MG PO TABS
60.0000 mg | ORAL_TABLET | Freq: Once | ORAL | Status: AC
  Administered 2020-01-22: 60 mg via ORAL
  Filled 2020-01-22: qty 1

## 2020-01-22 MED ORDER — HYDROCODONE-ACETAMINOPHEN 5-325 MG PO TABS: 1.0000 | ORAL_TABLET | ORAL | 0 refills | Status: AC | PRN

## 2020-01-22 NOTE — Discharge Instructions (Addendum)
You have been prescribed another tapering dose of prednisone.  You may take the hydrocodone prescribed for pain relief.  This will make you drowsy - do not drive within 4 hours of taking this medication.  I recommend a clear liquid diet for the next 48 hours, then you may slowly add back a regular diet.  Get rechecked for any worsened pain, or if you develop fevers or diarrhea.  I have referred you to a Gastroenterologist in [place] for further evaluation of your symptoms. You have also been prescribed some potassium to take for the next week.

## 2020-10-15 IMAGING — CT CT ABD-PELV W/ CM
2 of 5 series · 15 of 46 positions shown, 17 images · IV contrast (Omnipaque or Isovue)
Comparison: CT renal colic 02/19/2017

CLINICAL DATA: Right upper quadrant pain, no fever, history of
Crohn's

EXAM:
CT ABDOMEN AND PELVIS WITH CONTRAST
TECHNIQUE: Multidetector CT imaging of the abdomen and pelvis was performed
using the standard protocol following bolus administration of
intravenous contrast.
CONTRAST:  100mL OMNIPAQUE IOHEXOL 300 MG/ML  SOLN

[Series 2: axial st · axial · 0.73mm/px · z∈[+750,+1140]mm · 12 of 90 slices shown, 14 images]
[im 6/90  soft-tissue]
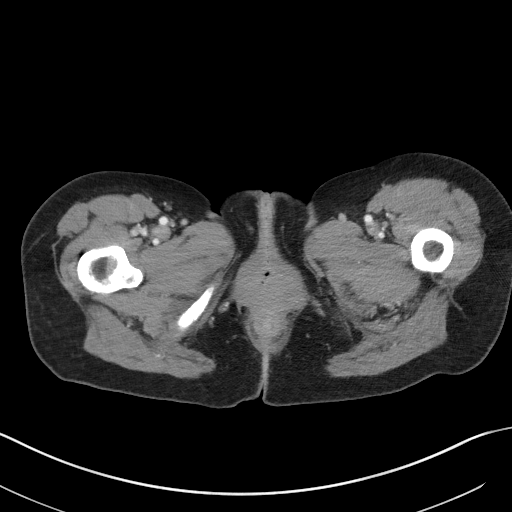
[im 6/90  bone]
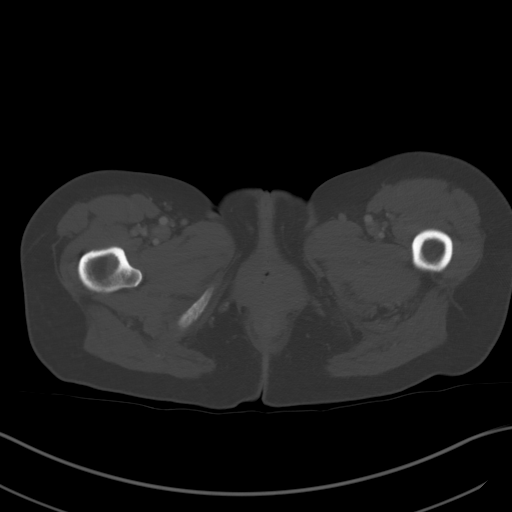
[im 12/90  soft-tissue]
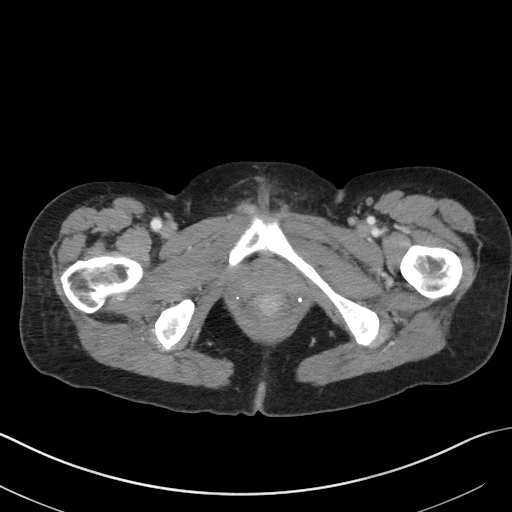
[im 23/90  soft-tissue]
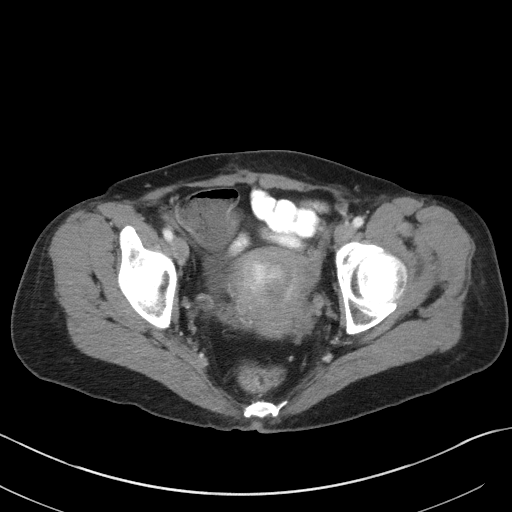
[im 28/90  soft-tissue]
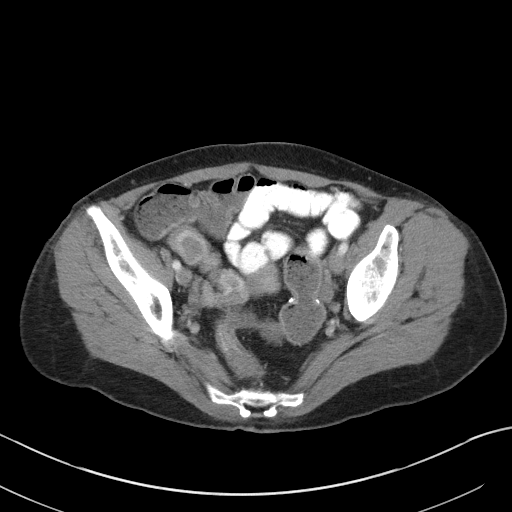
[im 34/90  soft-tissue]
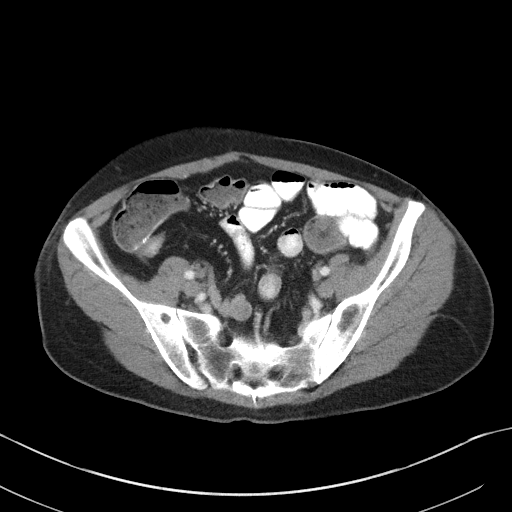
[im 39/90  soft-tissue]
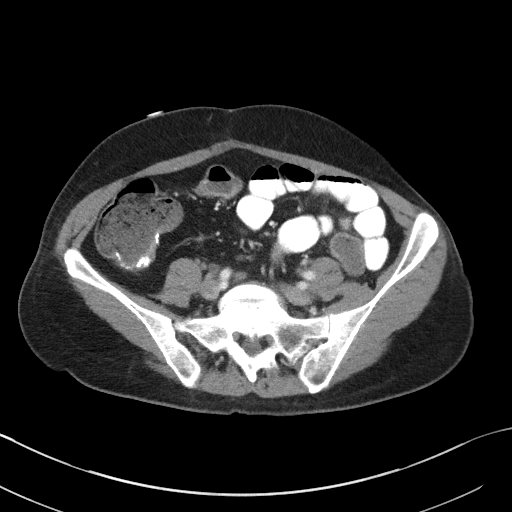
[im 51/90  soft-tissue]
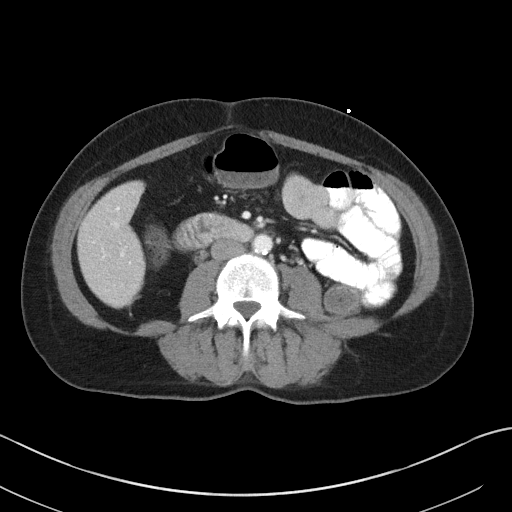
[im 56/90  soft-tissue]
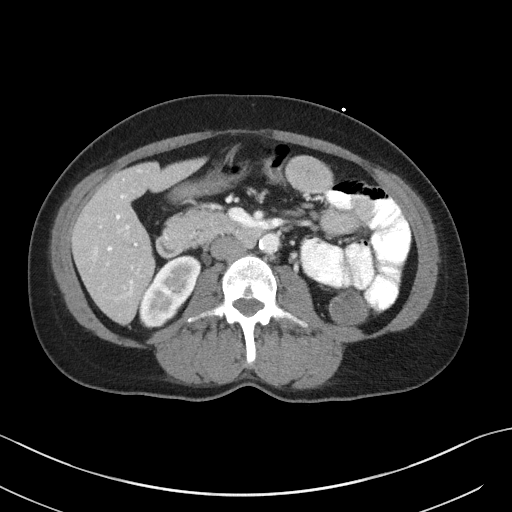
[im 62/90  soft-tissue]
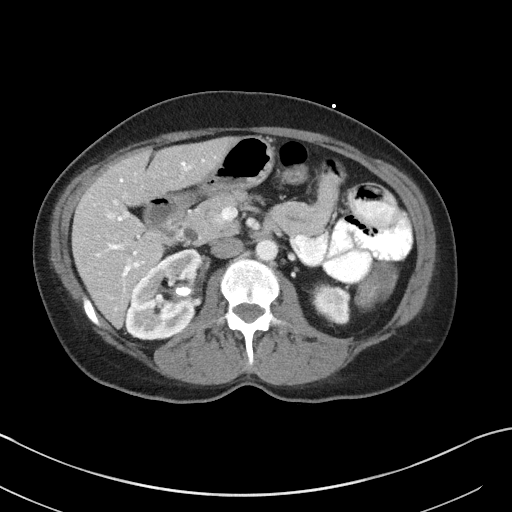
[im 62/90  bone]
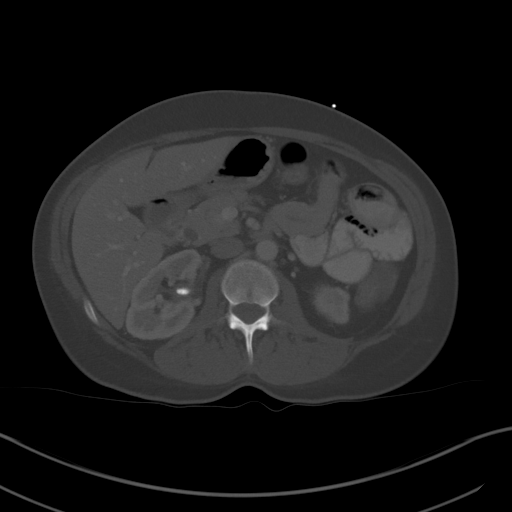
[im 67/90  soft-tissue]
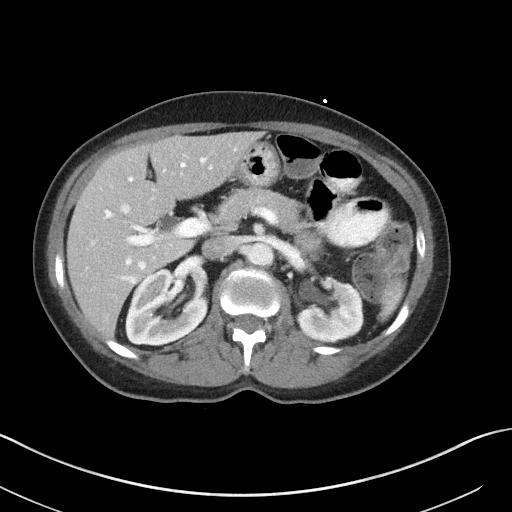
[im 78/90  soft-tissue]
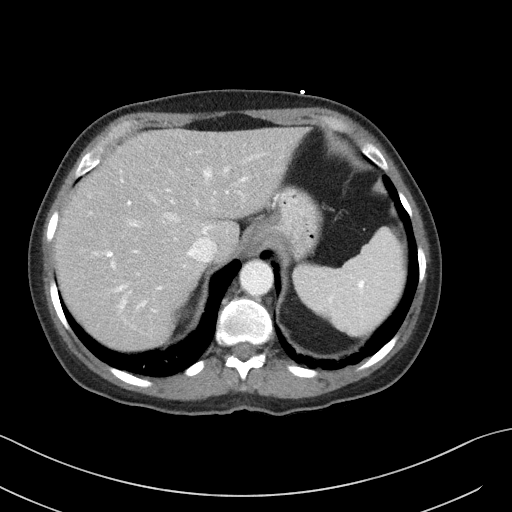
[im 84/90  soft-tissue]
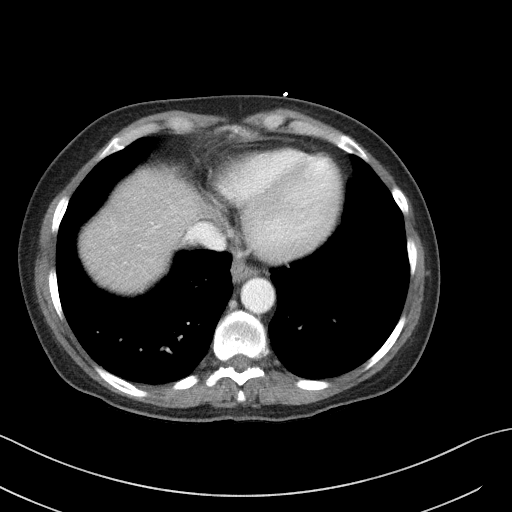

[Series 5: coronal st · coronal · 0.78mm/px · 3 of 90 slices shown]
[im 30/90  soft-tissue]
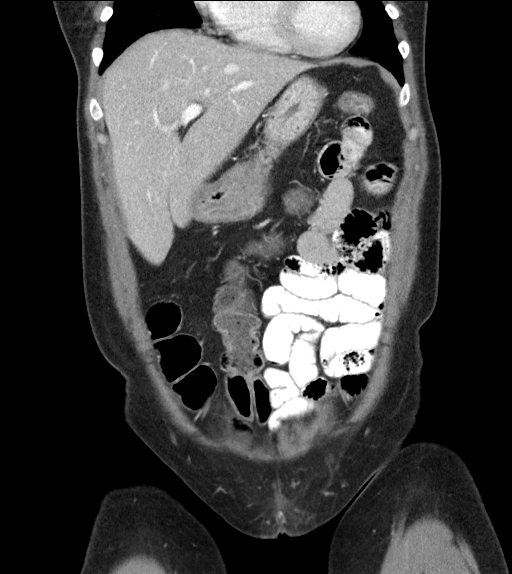
[im 40/90  soft-tissue]
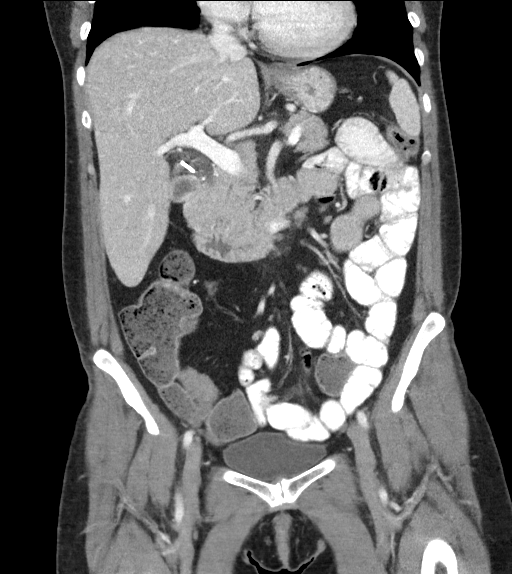
[im 50/90  soft-tissue]
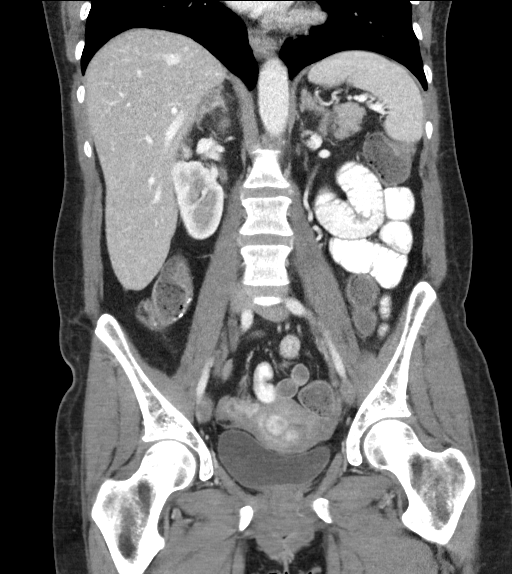

[15 of 46 positions shown; findings below may reference images not displayed]

FINDINGS: Lower chest: Lung bases are clear. Normal heart size. No pericardial
effusion.

Hepatobiliary: Diffuse hepatic hypoattenuation compatible with
hepatic steatosis. No focal liver abnormality is seen. Patient is
post cholecystectomy. Slight prominence of the biliary tree likely
related to reservoir effect. No calcified intraductal gallstones.

Pancreas: Unremarkable. No pancreatic ductal dilatation or
surrounding inflammatory changes.

Spleen: Normal in size without focal abnormality.

Adrenals/Urinary Tract: 1.4 cm intermediate attenuation lesion in
the right adrenal gland is unchanged since 1511 and likely reflects
a benign adenoma. No concerning adrenal lesions. There is extensive
asymmetric atrophy of the left kidney. No concerning renal lesions.
Subcentimeter hypertension foci in the kidneys are too small to
fully characterize on CT imaging but statistically likely benign.
Bilateral nonobstructing calculi in both kidneys, largest in the
right renal collecting system measuring up to 8 mm in size.

Stomach/Bowel: Distal esophagus, stomach and duodenum are
unremarkable. Enteric contrast media was administered. There is
passage of contrast to the mid small bowel. Short segmental loops of
edematous mural thickening are present throughout the small bowel
(2/59) some terminal ileal mural thickening is noted as well (2/63)
there is an ileocolic anastomosis. Mild edematous thickening versus
peristalsis of the colon ([DATE]). Distal colonic anastomosis in the
left lower quadrant is patent (2/62). Minimal colorectal thickening
is suspected as well (2/71).

Vascular/Lymphatic: Atherosclerotic plaque within the normal caliber
aorta.

Reproductive: Heterogeneous enhancement of the uterus likely
reflects some intramural fibroids. No acute or worrisome adnexal
lesions.

Other: Postsurgical changes of the anterior abdomen. No bowel
containing hernias. Small fat containing left inguinal hernia. No
free air or fluid within the abdomen or pelvis.

Musculoskeletal: No acute osseous abnormality or suspicious osseous
lesion. Degenerative changes are present at L5-S1.
IMPRESSION: 1. Multiple sites of segmental mural thickening throughout the small
bowel are concerning for potential Crohn's flare.
2. Mild edematous thickening versus peristalsis of the colon may
represent additional site of colitis.
3. Postsurgical changes of the anterior abdomen with patent
ileocolic and distal colic anastomosis.
4. Bilateral nephrolithiasis.  Left renal atrophy.
5. Hepatic steatosis.
6. Stable right adrenal lesion likely represents a benign adenoma.
